# Patient Record
Sex: Male | Born: 1970 | Race: White | Hispanic: No | Marital: Married | Smoking: Never smoker
Health system: Southern US, Community
[De-identification: ages and names within clinical notes are randomized; demographics above are authoritative.]

## PROBLEM LIST (undated history)

## (undated) DIAGNOSIS — J45909 Unspecified asthma, uncomplicated: Secondary | ICD-10-CM

## (undated) DIAGNOSIS — Z1211 Encounter for screening for malignant neoplasm of colon: Secondary | ICD-10-CM

## (undated) DIAGNOSIS — R0789 Other chest pain: Secondary | ICD-10-CM

## (undated) DIAGNOSIS — R310 Gross hematuria: Secondary | ICD-10-CM

## (undated) DIAGNOSIS — Z8249 Family history of ischemic heart disease and other diseases of the circulatory system: Secondary | ICD-10-CM

## (undated) DIAGNOSIS — E7800 Pure hypercholesterolemia, unspecified: Secondary | ICD-10-CM

## (undated) DIAGNOSIS — Q433 Congenital malformations of intestinal fixation: Secondary | ICD-10-CM

## (undated) DIAGNOSIS — Z87442 Personal history of urinary calculi: Secondary | ICD-10-CM

## (undated) HISTORY — DX: Other chest pain: R07.89

## (undated) HISTORY — DX: Unspecified asthma, uncomplicated: J45.909

## (undated) HISTORY — PX: WISDOM TOOTH EXTRACTION: SHX21

## (undated) HISTORY — DX: Pure hypercholesterolemia, unspecified: E78.00

## (undated) HISTORY — DX: Encounter for screening for malignant neoplasm of colon: Z12.11

## (undated) HISTORY — PX: MOLE REMOVAL: SHX2046

## (undated) HISTORY — DX: Gross hematuria: R31.0

## (undated) HISTORY — DX: Congenital malformations of intestinal fixation: Q43.3

## (undated) HISTORY — DX: Family history of ischemic heart disease and other diseases of the circulatory system: Z82.49

---

## 2002-02-17 DIAGNOSIS — J45909 Unspecified asthma, uncomplicated: Secondary | ICD-10-CM

## 2002-02-17 HISTORY — DX: Unspecified asthma, uncomplicated: J45.909

## 2003-02-24 ENCOUNTER — Encounter: Admission: RE | Admit: 2003-02-24 | Discharge: 2003-02-24 | Payer: Self-pay | Admitting: Family Medicine

## 2004-09-20 ENCOUNTER — Ambulatory Visit: Payer: Self-pay | Admitting: Family Medicine

## 2006-02-20 ENCOUNTER — Ambulatory Visit: Payer: Self-pay | Admitting: Family Medicine

## 2006-04-08 ENCOUNTER — Ambulatory Visit: Payer: Self-pay | Admitting: Family Medicine

## 2006-08-13 ENCOUNTER — Encounter: Payer: Self-pay | Admitting: Family Medicine

## 2006-08-13 DIAGNOSIS — J45909 Unspecified asthma, uncomplicated: Secondary | ICD-10-CM | POA: Insufficient documentation

## 2006-09-03 ENCOUNTER — Ambulatory Visit: Payer: Self-pay | Admitting: Family Medicine

## 2006-09-03 LAB — CONVERTED CEMR LAB
ALT: 14 units/L (ref 0–53)
AST: 17 units/L (ref 0–37)
Alkaline Phosphatase: 63 units/L (ref 39–117)
BUN: 16 mg/dL (ref 6–23)
Basophils Relative: 0.5 % (ref 0.0–1.0)
CO2: 33 meq/L — ABNORMAL HIGH (ref 19–32)
Calcium: 9.4 mg/dL (ref 8.4–10.5)
Chloride: 104 meq/L (ref 96–112)
Creatinine, Ser: 1 mg/dL (ref 0.4–1.5)
Eosinophils Relative: 2.8 % (ref 0.0–5.0)
GFR calc Af Amer: 108 mL/min
Glucose, Bld: 96 mg/dL (ref 70–99)
HDL: 32.2 mg/dL — ABNORMAL LOW (ref >39.0)
LDL Cholesterol: 88 mg/dL (ref 0–99)
Monocytes Relative: 11.8 % — ABNORMAL HIGH (ref 3.0–11.0)
Platelets: 237 10*3/uL (ref 150–400)
RBC: 4.72 M/uL (ref 4.22–5.81)
RDW: 12.5 % (ref 11.5–14.6)
Total Bilirubin: 1 mg/dL (ref 0.3–1.2)
Total Protein: 6.9 g/dL (ref 6.0–8.3)
Triglycerides: 75 mg/dL (ref 0–149)
VLDL: 15 mg/dL (ref 0–40)
WBC: 5.7 10*3/uL (ref 4.5–10.5)

## 2006-09-10 ENCOUNTER — Ambulatory Visit: Payer: Self-pay | Admitting: Family Medicine

## 2006-09-10 DIAGNOSIS — R109 Unspecified abdominal pain: Secondary | ICD-10-CM | POA: Insufficient documentation

## 2006-09-10 DIAGNOSIS — IMO0002 Reserved for concepts with insufficient information to code with codable children: Secondary | ICD-10-CM | POA: Insufficient documentation

## 2006-09-10 DIAGNOSIS — M75100 Unspecified rotator cuff tear or rupture of unspecified shoulder, not specified as traumatic: Secondary | ICD-10-CM

## 2006-12-07 DIAGNOSIS — M549 Dorsalgia, unspecified: Secondary | ICD-10-CM | POA: Insufficient documentation

## 2006-12-11 ENCOUNTER — Ambulatory Visit: Payer: Self-pay | Admitting: Family Medicine

## 2007-11-02 ENCOUNTER — Ambulatory Visit: Payer: Self-pay | Admitting: Family Medicine

## 2007-11-02 LAB — CONVERTED CEMR LAB
ALT: 16 units/L (ref 0–53)
AST: 19 units/L (ref 0–37)
Basophils Absolute: 0 10*3/uL (ref 0.0–0.1)
Basophils Relative: 0.9 % (ref 0.0–3.0)
Bilirubin Urine: NEGATIVE
Bilirubin, Direct: 0.1 mg/dL (ref 0.0–0.3)
CO2: 31 meq/L (ref 19–32)
Chloride: 111 meq/L (ref 96–112)
Cholesterol: 117 mg/dL (ref 0–200)
LDL Cholesterol: 78 mg/dL (ref 0–99)
Lymphocytes Relative: 28.8 % (ref 12.0–46.0)
MCHC: 34.3 g/dL (ref 30.0–36.0)
Neutrophils Relative %: 54.3 % (ref 43.0–77.0)
Nitrite: NEGATIVE
Protein, U semiquant: NEGATIVE
RBC: 4.39 M/uL (ref 4.22–5.81)
RDW: 13.1 % (ref 11.5–14.6)
Sodium: 143 meq/L (ref 135–145)
Total Bilirubin: 0.9 mg/dL (ref 0.3–1.2)
Total CHOL/HDL Ratio: 4.8
Urobilinogen, UA: 0.2
VLDL: 15 mg/dL (ref 0–40)

## 2007-11-09 ENCOUNTER — Ambulatory Visit: Payer: Self-pay | Admitting: Family Medicine

## 2009-10-26 ENCOUNTER — Ambulatory Visit: Payer: Self-pay | Admitting: Family Medicine

## 2009-10-26 LAB — CONVERTED CEMR LAB
AST: 18 units/L (ref 0–37)
Albumin: 4.4 g/dL (ref 3.5–5.2)
Alkaline Phosphatase: 57 units/L (ref 39–117)
Basophils Absolute: 0 10*3/uL (ref 0.0–0.1)
Bilirubin, Direct: 0.2 mg/dL (ref 0.0–0.3)
Blood in Urine, dipstick: NEGATIVE
Calcium: 9.5 mg/dL (ref 8.4–10.5)
GFR calc non Af Amer: 91.82 mL/min (ref >60)
Glucose, Bld: 94 mg/dL (ref 70–99)
HDL: 33.7 mg/dL — ABNORMAL LOW (ref >39.00)
Hemoglobin: 14.3 g/dL (ref 13.0–17.0)
Lymphocytes Relative: 33.6 % (ref 12.0–46.0)
Monocytes Relative: 10.2 % (ref 3.0–12.0)
Neutro Abs: 2.8 10*3/uL (ref 1.4–7.7)
Neutrophils Relative %: 53.3 % (ref 43.0–77.0)
Nitrite: NEGATIVE
Protein, U semiquant: NEGATIVE
RDW: 13.8 % (ref 11.5–14.6)
Sodium: 141 meq/L (ref 135–145)
Total Bilirubin: 0.9 mg/dL (ref 0.3–1.2)
Total CHOL/HDL Ratio: 4
VLDL: 12.4 mg/dL (ref 0.0–40.0)
WBC Urine, dipstick: NEGATIVE
pH: 6

## 2009-11-01 ENCOUNTER — Ambulatory Visit: Payer: Self-pay | Admitting: Family Medicine

## 2010-03-09 ENCOUNTER — Encounter: Payer: Self-pay | Admitting: Family Medicine

## 2010-03-19 NOTE — Assessment & Plan Note (Signed)
Summary: cpx/njr   Vital Signs:  Patient profile:   40 year old male Height:      73.5 inches Weight:      191 pounds BMI:     24.95 Temp:     98.3 degrees F oral BP sitting:   110 / 80  (right arm) Cuff size:   regular  Vitals Entered By: Kathrynn Speed CMA (November 01, 2009 2:18 PM) CC: cpx, with lab review, src Is Patient Diabetic? No Flu Vaccine Consent Questions     Do you have a history of severe allergic reactions to this vaccine? no    Any prior history of allergic reactions to egg and/or gelatin? no    Do you have a sensitivity to the preservative Thimersol? no    Do you have a past history of Guillan-Barre Syndrome? no    Do you currently have an acute febrile illness? no    Have you ever had a severe reaction to latex? no    Vaccine information given and explained to patient? yes    Are you currently pregnant? no    Lot Number:[id number]   Exp Date:08/17/2010   Site Given  Left Deltoid IM   CC:  cpx, with lab review, and src.  History of Present Illness: Arthur Allen is a 40 year old, married male, nonsmoker, who comes in today for general medical examination since he is 40 years old.  He always been in excellent health.  Has had no chronic health problems.  He takes [place].  Review of systems negative.  Social history he and his wife Arthur Allen and two children.  Continue to be well.  He has 71 year old daughter and a 45-year-old son.  Preventive Screening-Counseling & Management  Alcohol-Tobacco     Smoking Status: never     Passive Smoke Exposure: no  Current Medications (verified): 1)  Aspir-81 81 Mg Tbec (Aspirin) .... Take 1 Tablet By Mouth Once A Day 2)  Co Q-10 100 Mg Caps (Coenzyme Q10) .... Take Once A Day 3)  Fish Oil 500 Mg Caps (Omega-3 Fatty Acids) .... Take 3 Capsule By Mouth Once A Day 4)  Mvi  Allergies (verified): 1)  Penicillin V Potassium (Penicillin V Potassium)  Past History:  Past medical, surgical, family and social histories (including  risk factors) reviewed, and no changes noted (except as noted below).  Past Medical History: Reviewed history from 08/13/2006 and no changes required. Asthma  Past Surgical History: Reviewed history from 08/13/2006 and no changes required. Denies surgical history  Family History: Reviewed history from 12/11/2006 and no changes required. Family History of CAD Male 1st degree relative <60 father died at 21 of an MI.  Mother said a history of ovarian cancer  Social History: Reviewed history from 09/10/2006 and no changes required. Married Never Smoked Alcohol use-no Regular exercise-yes  Review of Systems      See HPI  Physical Exam  General:  Well-developed,well-nourished,in no acute distress; alert,appropriate and cooperative throughout examination Head:  Normocephalic and atraumatic without obvious abnormalities. No apparent alopecia or balding. Eyes:  No corneal or conjunctival inflammation noted. EOMI. Perrla. Funduscopic exam benign, without hemorrhages, exudates or papilledema. Vision grossly normal. Ears:  External ear exam shows no significant lesions or deformities.  Otoscopic examination reveals clear canals, tympanic membranes are intact bilaterally without bulging, retraction, inflammation or discharge. Hearing is grossly normal bilaterally. Nose:  External nasal examination shows no deformity or inflammation. Nasal mucosa are pink and moist without lesions or exudates. Mouth:  Oral  mucosa and oropharynx without lesions or exudates.  Teeth in good repair. Neck:  No deformities, masses, or tenderness noted. Chest Wall:  No deformities, masses, tenderness or gynecomastia noted. Breasts:  No masses or gynecomastia noted Lungs:  Normal respiratory effort, chest expands symmetrically. Lungs are clear to auscultation, no crackles or wheezes. Heart:  Normal rate and regular rhythm. S1 and S2 normal without gallop, murmur, click, rub or other extra sounds. Abdomen:  Bowel  sounds positive,abdomen soft and non-tender without masses, organomegaly or hernias noted. Rectal:  No external abnormalities noted. Normal sphincter tone. No rectal masses or tenderness. Genitalia:  Testes bilaterally descended without nodularity, tenderness or masses. No scrotal masses or lesions. No penis lesions or urethral discharge. Prostate:  Prostate gland firm and smooth, no enlargement, nodularity, tenderness, mass, asymmetry or induration. Msk:  No deformity or scoliosis noted of thoracic or lumbar spine.   Pulses:  R and L carotid,radial,femoral,dorsalis pedis and posterior tibial pulses are full and equal bilaterally Extremities:  No clubbing, cyanosis, edema, or deformity noted with normal full range of motion of all joints.   Neurologic:  No cranial nerve deficits noted. Station and gait are normal. Plantar reflexes are down-going bilaterally. DTRs are symmetrical throughout. Sensory, motor and coordinative functions appear intact. Skin:  Intact without suspicious lesions or rashes Cervical Nodes:  No lymphadenopathy noted Axillary Nodes:  No palpable lymphadenopathy Inguinal Nodes:  No significant adenopathy Psych:  Cognition and judgment appear intact. Alert and cooperative with normal attention span and concentration. No apparent delusions, illusions, hallucinations   Impression & Recommendations:  Problem # 1:  HEALTH SCREENING (ICD-V70.0) Assessment Unchanged  Orders: EKG w/ Interpretation (93000)  Complete Medication List: 1)  Aspir-81 81 Mg Tbec (Aspirin) .... Take 1 tablet by mouth once a day 2)  Co Q-10 100 Mg Caps (Coenzyme q10) .... Take once a day 3)  Fish Oil 500 Mg Caps (Omega-3 fatty acids) .... Take 3 capsule by mouth once a day 4)  Mvi   Other Orders: Admin 1st Vaccine (29562) Flu Vaccine 52yrs + (13086)  Patient Instructions: 1)  It is important that you exercise regularly at least 20 minutes 5 times a week. If you develop chest pain, have severe  difficulty breathing, or feel very tired , stop exercising immediately and seek medical attention. 2)  Take an Aspirin every day 3)  return in two years for a general physical exam

## 2011-02-27 ENCOUNTER — Ambulatory Visit: Payer: Worker's Compensation

## 2011-02-28 ENCOUNTER — Ambulatory Visit: Payer: Worker's Compensation

## 2011-03-03 ENCOUNTER — Ambulatory Visit: Payer: Worker's Compensation

## 2012-08-24 ENCOUNTER — Ambulatory Visit: Payer: Self-pay | Admitting: Family Medicine

## 2012-09-01 ENCOUNTER — Ambulatory Visit (INDEPENDENT_AMBULATORY_CARE_PROVIDER_SITE_OTHER): Payer: BC Managed Care – PPO | Admitting: Family Medicine

## 2012-09-01 ENCOUNTER — Encounter: Payer: Self-pay | Admitting: Family Medicine

## 2012-09-01 VITALS — BP 101/65 | HR 57 | Temp 98.4°F | Resp 16 | Ht 73.5 in | Wt 186.0 lb

## 2012-09-01 DIAGNOSIS — H109 Unspecified conjunctivitis: Secondary | ICD-10-CM | POA: Insufficient documentation

## 2012-09-01 DIAGNOSIS — Z0000 Encounter for general adult medical examination without abnormal findings: Secondary | ICD-10-CM | POA: Insufficient documentation

## 2012-09-01 LAB — LIPID PANEL
Cholesterol: 149 mg/dL (ref 0–200)
HDL: 32.2 mg/dL — ABNORMAL LOW (ref >39.00)
LDL Cholesterol: 99 mg/dL (ref 0–99)
Total CHOL/HDL Ratio: 5
Triglycerides: 89 mg/dL (ref 0.0–149.0)

## 2012-09-01 LAB — COMPREHENSIVE METABOLIC PANEL
ALT: 15 U/L (ref 0–53)
AST: 20 U/L (ref 0–37)
BUN: 17 mg/dL (ref 6–23)
Creatinine, Ser: 1 mg/dL (ref 0.4–1.5)
GFR: 82.58 mL/min (ref >60.00)
Total Bilirubin: 0.7 mg/dL (ref 0.3–1.2)

## 2012-09-01 LAB — CBC WITH DIFFERENTIAL/PLATELET
Basophils Absolute: 0.1 10*3/uL (ref 0.0–0.1)
Basophils Relative: 0.9 % (ref 0.0–3.0)
Eosinophils Absolute: 0.3 10*3/uL (ref 0.0–0.7)
HCT: 44.7 % (ref 39.0–52.0)
Hemoglobin: 14.6 g/dL (ref 13.0–17.0)
Lymphs Abs: 2 10*3/uL (ref 0.7–4.0)
MCHC: 32.7 g/dL (ref 30.0–36.0)
MCV: 91.6 fl (ref 78.0–100.0)
Monocytes Absolute: 0.7 10*3/uL (ref 0.1–1.0)
Neutro Abs: 3.8 10*3/uL (ref 1.4–7.7)
RDW: 13.8 % (ref 11.5–14.6)
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
## 2012-09-01 NOTE — Assessment & Plan Note (Signed)
Reviewed age and gender appropriate health maintenance issues (prudent diet, regular exercise, health risks of tobacco and excessive alcohol, use of seatbelts, fire alarms in home, use of sunscreen).  Also reviewed age and gender appropriate health screening as well as vaccine recommendations. Vaccines UTD. Health panel labs today. Discussed ideal exercise and dietary recommendations for CVD prevention.

## 2012-09-01 NOTE — Assessment & Plan Note (Signed)
Resolved viral conjunctivitis.  Reassured pt.

## 2013-09-01 ENCOUNTER — Other Ambulatory Visit (INDEPENDENT_AMBULATORY_CARE_PROVIDER_SITE_OTHER): Payer: 59

## 2013-09-01 DIAGNOSIS — Z0000 Encounter for general adult medical examination without abnormal findings: Secondary | ICD-10-CM

## 2013-09-01 LAB — CBC WITH DIFFERENTIAL/PLATELET
BASOS PCT: 0.7 % (ref 0.0–3.0)
Basophils Absolute: 0 10*3/uL (ref 0.0–0.1)
EOS PCT: 3.3 % (ref 0.0–5.0)
Eosinophils Absolute: 0.2 10*3/uL (ref 0.0–0.7)
HCT: 42.7 % (ref 39.0–52.0)
Hemoglobin: 14.2 g/dL (ref 13.0–17.0)
LYMPHS PCT: 28.3 % (ref 12.0–46.0)
Lymphs Abs: 1.6 10*3/uL (ref 0.7–4.0)
MCHC: 33.2 g/dL (ref 30.0–36.0)
MCV: 90 fl (ref 78.0–100.0)
MONO ABS: 0.5 10*3/uL (ref 0.1–1.0)
Monocytes Relative: 9.3 % (ref 3.0–12.0)
NEUTROS PCT: 58.4 % (ref 43.0–77.0)
Neutro Abs: 3.3 10*3/uL (ref 1.4–7.7)
Platelets: 252 10*3/uL (ref 150.0–400.0)
RBC: 4.74 Mil/uL (ref 4.22–5.81)
RDW: 14.1 % (ref 11.5–15.5)
WBC: 5.7 10*3/uL (ref 4.0–10.5)

## 2013-09-01 LAB — COMPREHENSIVE METABOLIC PANEL
ALBUMIN: 4.5 g/dL (ref 3.5–5.2)
ALT: 20 U/L (ref 0–53)
AST: 20 U/L (ref 0–37)
Alkaline Phosphatase: 68 U/L (ref 39–117)
BUN: 16 mg/dL (ref 6–23)
CALCIUM: 9.6 mg/dL (ref 8.4–10.5)
CHLORIDE: 101 meq/L (ref 96–112)
CO2: 27 mEq/L (ref 19–32)
Creatinine, Ser: 1.1 mg/dL (ref 0.4–1.5)
GFR: 79.55 mL/min (ref >60.00)
GLUCOSE: 91 mg/dL (ref 70–99)
POTASSIUM: 4.2 meq/L (ref 3.5–5.1)
Sodium: 136 mEq/L (ref 135–145)
Total Bilirubin: 0.7 mg/dL (ref 0.2–1.2)
Total Protein: 7 g/dL (ref 6.0–8.3)

## 2013-09-01 LAB — LIPID PANEL
CHOLESTEROL: 154 mg/dL (ref 0–200)
HDL: 33.2 mg/dL — ABNORMAL LOW (ref >39.00)
LDL Cholesterol: 105 mg/dL — ABNORMAL HIGH (ref 0–99)
NonHDL: 120.8
TRIGLYCERIDES: 77 mg/dL (ref 0.0–149.0)
Total CHOL/HDL Ratio: 5
VLDL: 15.4 mg/dL (ref 0.0–40.0)

## 2013-09-01 LAB — TSH: TSH: 1.72 u[IU]/mL (ref 0.35–4.50)

## 2013-09-02 ENCOUNTER — Other Ambulatory Visit: Payer: BC Managed Care – PPO

## 2013-09-09 ENCOUNTER — Encounter: Payer: BC Managed Care – PPO | Admitting: Family Medicine

## 2013-09-15 ENCOUNTER — Encounter: Payer: Self-pay | Admitting: Family Medicine

## 2013-09-15 ENCOUNTER — Ambulatory Visit (INDEPENDENT_AMBULATORY_CARE_PROVIDER_SITE_OTHER): Payer: 59 | Admitting: Family Medicine

## 2013-09-15 VITALS — BP 115/70 | HR 68 | Temp 98.4°F | Resp 18 | Ht 73.5 in | Wt 193.0 lb

## 2013-09-15 DIAGNOSIS — Z0000 Encounter for general adult medical examination without abnormal findings: Secondary | ICD-10-CM
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
## 2013-09-15 NOTE — Assessment & Plan Note (Signed)
Reviewed age and gender appropriate health maintenance issues (prudent diet, regular exercise, health risks of tobacco and excessive alcohol, use of seatbelts, fire alarms in home, use of sunscreen).  Also reviewed age and gender appropriate health screening as well as vaccine recommendations. HP labs all normal I recommended a trial of bid otc lamisil for empiric treatment of tinea versicolor on arm and neck.  If not improving with this in 1 mo then I recommended he arrange f/u with his dermatologist to take a look at these spots.

## 2013-09-15 NOTE — Progress Notes (Signed)
Pre visit review using our clinic review tool, if applicable. No additional management support is needed unless otherwise documented below in the visit note. 

## 2015-02-18 HISTORY — PX: OTHER SURGICAL HISTORY: SHX169

## 2015-05-22 ENCOUNTER — Other Ambulatory Visit (INDEPENDENT_AMBULATORY_CARE_PROVIDER_SITE_OTHER): Payer: BLUE CROSS/BLUE SHIELD

## 2015-05-22 DIAGNOSIS — Z0000 Encounter for general adult medical examination without abnormal findings: Secondary | ICD-10-CM | POA: Diagnosis not present

## 2015-05-22 LAB — CBC WITH DIFFERENTIAL/PLATELET
BASOS PCT: 0.7 % (ref 0.0–3.0)
Basophils Absolute: 0 10*3/uL (ref 0.0–0.1)
EOS PCT: 5.4 % — AB (ref 0.0–5.0)
Eosinophils Absolute: 0.3 10*3/uL (ref 0.0–0.7)
HEMATOCRIT: 42.5 % (ref 39.0–52.0)
HEMOGLOBIN: 14.3 g/dL (ref 13.0–17.0)
LYMPHS PCT: 26.2 % (ref 12.0–46.0)
Lymphs Abs: 1.3 10*3/uL (ref 0.7–4.0)
MCHC: 33.6 g/dL (ref 30.0–36.0)
MCV: 88.6 fl (ref 78.0–100.0)
MONO ABS: 0.5 10*3/uL (ref 0.1–1.0)
MONOS PCT: 9.9 % (ref 3.0–12.0)
Neutro Abs: 2.8 10*3/uL (ref 1.4–7.7)
Neutrophils Relative %: 57.8 % (ref 43.0–77.0)
Platelets: 255 10*3/uL (ref 150.0–400.0)
RBC: 4.79 Mil/uL (ref 4.22–5.81)
RDW: 14.3 % (ref 11.5–15.5)
WBC: 4.8 10*3/uL (ref 4.0–10.5)

## 2015-05-22 LAB — LIPID PANEL
Cholesterol: 146 mg/dL (ref 0–200)
HDL: 37.2 mg/dL — ABNORMAL LOW (ref >39.00)
LDL CALC: 91 mg/dL (ref 0–99)
NONHDL: 108.67
Total CHOL/HDL Ratio: 4
Triglycerides: 88 mg/dL (ref 0.0–149.0)
VLDL: 17.6 mg/dL (ref 0.0–40.0)

## 2015-05-22 LAB — COMPREHENSIVE METABOLIC PANEL
ALBUMIN: 4.5 g/dL (ref 3.5–5.2)
ALK PHOS: 64 U/L (ref 39–117)
ALT: 15 U/L (ref 0–53)
AST: 16 U/L (ref 0–37)
BUN: 16 mg/dL (ref 6–23)
CO2: 30 mEq/L (ref 19–32)
CREATININE: 0.96 mg/dL (ref 0.40–1.50)
Calcium: 9.7 mg/dL (ref 8.4–10.5)
Chloride: 103 mEq/L (ref 96–112)
GFR: 89.47 mL/min (ref >60.00)
GLUCOSE: 101 mg/dL — AB (ref 70–99)
POTASSIUM: 4.2 meq/L (ref 3.5–5.1)
Sodium: 138 mEq/L (ref 135–145)
TOTAL PROTEIN: 6.7 g/dL (ref 6.0–8.3)
Total Bilirubin: 0.5 mg/dL (ref 0.2–1.2)

## 2015-05-22 LAB — TSH: TSH: 1.7 u[IU]/mL (ref 0.35–4.50)

## 2015-05-24 ENCOUNTER — Encounter: Payer: Self-pay | Admitting: Family Medicine

## 2015-05-24 ENCOUNTER — Ambulatory Visit (INDEPENDENT_AMBULATORY_CARE_PROVIDER_SITE_OTHER): Payer: BLUE CROSS/BLUE SHIELD | Admitting: Family Medicine

## 2015-05-24 VITALS — BP 102/70 | HR 53 | Temp 97.9°F | Resp 20 | Ht 73.5 in | Wt 198.2 lb

## 2015-05-24 DIAGNOSIS — R7301 Impaired fasting glucose: Secondary | ICD-10-CM | POA: Diagnosis not present

## 2015-05-24 DIAGNOSIS — Z23 Encounter for immunization: Secondary | ICD-10-CM

## 2015-05-24 DIAGNOSIS — Z0000 Encounter for general adult medical examination without abnormal findings: Secondary | ICD-10-CM

## 2015-05-24 LAB — POCT GLYCOSYLATED HEMOGLOBIN (HGB A1C): HEMOGLOBIN A1C: 5.4

## 2015-05-24 NOTE — Addendum Note (Signed)
Addended by: Smitty KnudsenSUTHERLAND, HEATHER K on: 05/24/2015 04:29 PM   Modules accepted: Orders, SmartSet

## 2015-05-24 NOTE — Progress Notes (Signed)
Office Note 05/24/2015  CC:  Chief Complaint  Patient presents with  . Annual Exam    HPI:  Arthur Allen is a 45 y.o. White male who is here for annual health maintenance exam. Feeling well, no acute complaints. Reviewed fasting lab results in detail.  The only mild concern was fasting glucose of 101. He is in favor of doing a POCT HbA1c today to look into this further.    Past Medical History  Diagnosis Date  . Asthmatic bronchitis 2004    Lone episode--?mold exposure when working around a flooded house  . Chest wall pain     Past Surgical History  Procedure Laterality Date  . Wisdom tooth extraction  age 45 yrs  . Mole removal      A few, benign.  Sees a dermatologist every few years.    Family History  Problem Relation Age of Onset  . Cancer Mother   . Heart disease Father   . COPD Maternal Grandfather   . Stroke Paternal Grandmother   . Heart disease Paternal Grandfather     Social History   Social History  . Marital Status: Married    Spouse Name: N/A  . Number of Children: N/A  . Years of Education: N/A   Occupational History  . Not on file.   Social History Main Topics  . Smoking status: Never Smoker   . Smokeless tobacco: Never Used  . Alcohol Use: Yes     Comment: rarely  . Drug Use: No  . Sexual Activity: Not on file   Other Topics Concern  . Not on file   Social History Narrative   Married, 2 children (1 boy 1 girl).   Orig from [place].   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.   No tob, rare alcohol, no drugs.   Exercise 3X/week.    Outpatient Prescriptions Prior to Visit  Medication Sig Dispense Refill  . aspirin 81 MG tablet Take 81 mg by mouth daily.    Marland Kitchen. co-enzyme Q-10 30 MG capsule Take 30 mg by mouth 3 (three) times daily.    . fish oil-omega-3 fatty acids 1000 MG capsule Take 2 g by mouth daily.    . Multiple Vitamin (MULTIVITAMIN)  capsule Take 1 capsule by mouth daily.     No facility-administered medications prior to visit.    Allergies  Allergen Reactions  . Penicillins     REACTION: as child    ROS Review of Systems  Constitutional: Negative for fever, chills, appetite change and fatigue.  HENT: Negative for congestion, dental problem, ear pain and sore throat.   Eyes: Negative for discharge, redness and visual disturbance.  Respiratory: Negative for cough, chest tightness, shortness of breath and wheezing.   Cardiovascular: Negative for chest pain, palpitations and leg swelling.  Gastrointestinal: Negative for nausea, vomiting, abdominal pain, diarrhea and blood in stool.  Genitourinary: Negative for dysuria, urgency, frequency, hematuria, flank pain and difficulty urinating.  Musculoskeletal: Negative for myalgias, back pain, joint swelling, arthralgias and neck stiffness.  Skin: Negative for pallor and rash.  Neurological: Negative for dizziness, speech difficulty, weakness and headaches.  Hematological: Negative for adenopathy. Does not bruise/bleed easily.  Psychiatric/Behavioral: Negative for confusion and sleep disturbance. The patient is not nervous/anxious.     PE; Blood pressure 102/70, pulse 53, temperature 97.9 F (36.6 C), temperature source Oral, resp. rate 20, height 6' 1.5" (1.867 m), weight 198 lb 4 oz (  89.926 kg), SpO2 95 %. Gen: Alert, well appearing.  Patient is oriented to person, place, time, and situation. AFFECT: pleasant, lucid thought and speech. ENT: Ears: EACs clear, normal epithelium.  TMs with good light reflex and landmarks bilaterally.  Eyes: no injection, icteris, swelling, or exudate.  EOMI, PERRLA. Nose: no drainage or turbinate edema/swelling.  No injection or focal lesion.  Mouth: lips without lesion/swelling.  Oral mucosa pink and moist.  Dentition intact and without obvious caries or gingival swelling.  Oropharynx without erythema, exudate, or swelling.  Neck:  supple/nontender.  No LAD, mass, or TM.  Carotid pulses 2+ bilaterally, without bruits. CV: RRR, no m/r/g.   LUNGS: CTA bilat, nonlabored resps, good aeration in all lung fields. ABD: soft, NT, ND, BS normal.  No hepatospenomegaly or mass.  No bruits. EXT: no clubbing, cyanosis, or edema.  Musculoskeletal: no joint swelling, erythema, warmth, or tenderness.  ROM of all joints intact. Skin - no sores or suspicious lesions or rashes or color changes   Pertinent labs:  Lab Results  Component Value Date   TSH 1.70 05/22/2015   Lab Results  Component Value Date   WBC 4.8 05/22/2015   HGB 14.3 05/22/2015   HCT 42.5 05/22/2015   MCV 88.6 05/22/2015   PLT 255.0 05/22/2015   Lab Results  Component Value Date   CREATININE 0.96 05/22/2015   BUN 16 05/22/2015   NA 138 05/22/2015   K 4.2 05/22/2015   CL 103 05/22/2015   CO2 30 05/22/2015   Lab Results  Component Value Date   ALT 15 05/22/2015   AST 16 05/22/2015   ALKPHOS 64 05/22/2015   BILITOT 0.5 05/22/2015   Lab Results  Component Value Date   CHOL 146 05/22/2015   Lab Results  Component Value Date   HDL 37.20* 05/22/2015   Lab Results  Component Value Date   LDLCALC 91 05/22/2015   Lab Results  Component Value Date   TRIG 88.0 05/22/2015   Lab Results  Component Value Date   CHOLHDL 4 05/22/2015   POCT HbA1c today: 5.4%  ASSESSMENT AND PLAN:   Reviewed age and gender appropriate health maintenance issues (prudent diet, regular exercise, health risks of tobacco and excessive alcohol, use of seatbelts, fire alarms in home, use of sunscreen).  Also reviewed age and gender appropriate health screening as well as vaccine recommendations. Tdap given today. Good fasting health panel results.  He'll work on diet/exercise/wt loss to try to help IFG and low HDL.  An After Visit Summary was printed and given to the patient.  FOLLOW UP:  Return in about 1 year (around 05/23/2016) for annual CPE with fasting labs the week  prior.  Signed:  Santiago Bumpers, MD           05/24/2015

## 2015-06-19 ENCOUNTER — Other Ambulatory Visit: Payer: Self-pay

## 2015-06-22 ENCOUNTER — Encounter: Payer: Self-pay | Admitting: Family Medicine

## 2015-06-28 ENCOUNTER — Encounter: Payer: Self-pay | Admitting: Family Medicine

## 2015-06-28 ENCOUNTER — Ambulatory Visit (INDEPENDENT_AMBULATORY_CARE_PROVIDER_SITE_OTHER): Payer: BLUE CROSS/BLUE SHIELD | Admitting: Family Medicine

## 2015-06-28 VITALS — BP 107/66 | HR 57 | Temp 98.0°F | Resp 16 | Ht 73.5 in | Wt 204.5 lb

## 2015-06-28 DIAGNOSIS — W57XXXA Bitten or stung by nonvenomous insect and other nonvenomous arthropods, initial encounter: Secondary | ICD-10-CM | POA: Diagnosis not present

## 2015-06-28 DIAGNOSIS — L089 Local infection of the skin and subcutaneous tissue, unspecified: Secondary | ICD-10-CM | POA: Diagnosis not present

## 2015-06-28 DIAGNOSIS — T148 Other injury of unspecified body region: Secondary | ICD-10-CM

## 2015-06-28 MED ORDER — DOXYCYCLINE HYCLATE 100 MG PO TABS: 100.0000 mg | ORAL_TABLET | Freq: Two times a day (BID) | ORAL | Status: DC

## 2015-06-28 MED ORDER — PREDNISONE 20 MG PO TABS: ORAL_TABLET | ORAL | Status: DC

## 2015-06-28 NOTE — Progress Notes (Signed)
Pre visit review using our clinic review tool, if applicable. No additional management support is needed unless otherwise documented below in the visit note. 

## 2015-06-28 NOTE — Progress Notes (Signed)
OFFICE VISIT  06/28/2015   CC:  Chief Complaint  Patient presents with  . Insect Bite    removed tick from back 2 days ago, area red, swollen and itchy    HPI:    Patient is a 45 y.o. Caucasian male who presents for tick bite.  Noted it on his back 2 days ago.  The bite area is slightly swollen, looks worse than usual bite from tick so he wanted to get it checked out.  No fever, no HA, no malaise, no myalgias, no other rashes. Has been applying some "first aid cream".  Past Medical History  Diagnosis Date  . Asthmatic bronchitis 2004    Lone episode--?mold exposure when working around a flooded house  . Chest wall pain     Past Surgical History  Procedure Laterality Date  . Wisdom tooth extraction  age 45 yrs  . Mole removal      A few, benign.  Sees a dermatologist every few years.    Outpatient Prescriptions Prior to Visit  Medication Sig Dispense Refill  . aspirin 81 MG tablet Take 81 mg by mouth daily.    Marland Kitchen. co-enzyme Q-10 30 MG capsule Take 30 mg by mouth 3 (three) times daily.    . fish oil-omega-3 fatty acids 1000 MG capsule Take 2 g by mouth daily.    . Multiple Vitamin (MULTIVITAMIN) capsule Take 1 capsule by mouth daily.     No facility-administered medications prior to visit.    Allergies  Allergen Reactions  . Penicillins     REACTION: as child    ROS As per HPI  PE: Blood pressure 107/66, pulse 57, temperature 98 F (36.7 C), temperature source Oral, resp. rate 16, height 6' 1.5" (1.867 m), weight 204 lb 8 oz (92.761 kg), SpO2 97 %. Gen: Alert, well appearing.  Patient is oriented to person, place, time, and situation. Right mid back with large splotch of pinkish erythema with bite mark in center---the pinkish area is indurated and mildly warm but nontender. No fluctuance.  I cannot appreciate a discreet abscess.  Longest dimension of the erythema is 12 cm, and the shortest dimension is 9 cm.  LABS:  none  IMPRESSION AND PLAN:  Tick bite,  possibly infected + significantly inflamed. Start prednisone 40mg  qd x 5d and doxycycline 100 mg bid x 7d. Recheck this in 4-5 d in office.  An After Visit Summary was printed and given to the patient.  FOLLOW UP: Return for 4-5 d f/u tick bite on back.  Signed:  Santiago BumpersPhil McGowen, MD           06/28/2015

## 2015-07-02 ENCOUNTER — Ambulatory Visit (INDEPENDENT_AMBULATORY_CARE_PROVIDER_SITE_OTHER): Payer: BLUE CROSS/BLUE SHIELD | Admitting: Family Medicine

## 2015-07-02 ENCOUNTER — Encounter: Payer: Self-pay | Admitting: Family Medicine

## 2015-07-02 ENCOUNTER — Ambulatory Visit: Payer: BLUE CROSS/BLUE SHIELD | Admitting: Family Medicine

## 2015-07-02 VITALS — BP 123/72 | HR 62 | Temp 97.9°F | Resp 16 | Ht 73.5 in | Wt 203.0 lb

## 2015-07-02 DIAGNOSIS — S60469D Insect bite (nonvenomous) of unspecified finger, subsequent encounter: Secondary | ICD-10-CM

## 2015-07-02 DIAGNOSIS — L089 Local infection of the skin and subcutaneous tissue, unspecified: Secondary | ICD-10-CM

## 2015-07-02 DIAGNOSIS — W57XXXD Bitten or stung by nonvenomous insect and other nonvenomous arthropods, subsequent encounter: Principal | ICD-10-CM

## 2015-07-02 NOTE — Progress Notes (Signed)
Pre visit review using our clinic review tool, if applicable. No additional management support is needed unless otherwise documented below in the visit note. 

## 2015-07-02 NOTE — Progress Notes (Signed)
OFFICE NOTE  07/02/2015  CC:  Chief Complaint  Patient presents with  . Follow-up    Tick bite     HPI: Patient is a 45 y.o. Caucasian male who is here for 4 day f/u tick bite on back that I felt was likely infected. Feeling good, no pain at bite area, just minimal itching.  Taking prednisone and doxycycline w/out side effect. No fevers, malaise, or myalgias or HA.  Pertinent PMH:  Past medical, surgical, social, and family history reviewed and no changes are noted since last office visit.  MEDS:  Outpatient Prescriptions Prior to Visit  Medication Sig Dispense Refill  . aspirin 81 MG tablet Take 81 mg by mouth daily.    Marland Kitchen. co-enzyme Q-10 30 MG capsule Take 30 mg by mouth 3 (three) times daily.    Marland Kitchen. doxycycline (VIBRA-TABS) 100 MG tablet Take 1 tablet (100 mg total) by mouth 2 (two) times daily. 14 tablet 0  . fish oil-omega-3 fatty acids 1000 MG capsule Take 2 g by mouth daily.    . Multiple Vitamin (MULTIVITAMIN) capsule Take 1 capsule by mouth daily.    . predniSONE (DELTASONE) 20 MG tablet 2 tabs po qd x 5d 10 tablet 0   No facility-administered medications prior to visit.    PE: Blood pressure 123/72, pulse 62, temperature 97.9 F (36.6 C), temperature source Oral, resp. rate 16, height 6' 1.5" (1.867 m), weight 203 lb (92.08 kg), SpO2 94 %. Gen: Alert, well appearing.  Patient is oriented to person, place, time, and situation. SKIN: left mid back with 4-5 cm oblong splotch of light pink skin with a small scab in the center. No significant induration, no fluctuance, no tenderness.  No warmth or streaking.  No target/bulls-eye lesion.  IMPRESSION AND PLAN:  Infected tick bite, nearly resolved on current treatments. Finish prednisone and doxycycline. Signs/symptoms to call or return for were reviewed and pt expressed understanding.  An After Visit Summary was printed and given to the patient.   FOLLOW UP: prn  Signed:  Santiago BumpersPhil Angell Honse, MD           07/02/2015

## 2015-10-09 ENCOUNTER — Other Ambulatory Visit: Payer: Self-pay | Admitting: Cardiology

## 2015-10-09 DIAGNOSIS — R0789 Other chest pain: Secondary | ICD-10-CM

## 2015-10-10 ENCOUNTER — Encounter: Payer: Self-pay | Admitting: Family Medicine

## 2015-10-16 HISTORY — PX: CARDIOVASCULAR STRESS TEST: SHX262

## 2015-10-17 ENCOUNTER — Ambulatory Visit
Admission: RE | Admit: 2015-10-17 | Discharge: 2015-10-17 | Disposition: A | Discharge status: Home / Self Care | Payer: No Typology Code available for payment source | Source: Ambulatory Visit | Attending: Cardiology | Admitting: Cardiology

## 2015-10-17 ENCOUNTER — Encounter: Payer: Self-pay | Admitting: Family Medicine

## 2015-10-17 DIAGNOSIS — R0789 Other chest pain: Secondary | ICD-10-CM

## 2017-10-08 ENCOUNTER — Encounter: Payer: Self-pay | Admitting: Family Medicine

## 2017-11-10 ENCOUNTER — Encounter: Payer: Self-pay | Admitting: Family Medicine

## 2017-11-10 ENCOUNTER — Ambulatory Visit (INDEPENDENT_AMBULATORY_CARE_PROVIDER_SITE_OTHER): Payer: 59 | Admitting: Family Medicine

## 2017-11-10 VITALS — BP 93/58 | HR 59 | Temp 98.5°F | Resp 16 | Ht 73.5 in | Wt 186.2 lb

## 2017-11-10 DIAGNOSIS — Z23 Encounter for immunization: Secondary | ICD-10-CM

## 2017-11-10 DIAGNOSIS — Z0000 Encounter for general adult medical examination without abnormal findings: Secondary | ICD-10-CM | POA: Diagnosis not present

## 2017-11-10 NOTE — Patient Instructions (Signed)

## 2017-11-10 NOTE — Progress Notes (Signed)
Office Note 11/10/2017  CC:  Chief Complaint  Patient presents with  . Annual Exam    Pt is fasting.    HPI:  Arthur Allen is a 47 y.o. male who is here for annual health maintenance exam.  TLC: doing great with nutrition (his son is the nutrition coach of the family), [place] [facility] [place] [place]. Runs about 2d/week as well.  Pt has done great with losing wt in 2019.  Dental and eye exams UTD.  Past Medical History:  Diagnosis Date  . Asthmatic bronchitis 2004   Lone episode--?mold exposure when working around a flooded house  . Chest wall pain   . Family history of early CAD    F. sudden death age 47.  Carmelina DanePat GF d of MI in his 5350s.  ETT NORMAL 10/16/15; coronary calcium score of ZERO 2017.    Past Surgical History:  Procedure Laterality Date  . CARDIOVASCULAR STRESS TEST  10/16/2015   NORMAL  . Coronary calcium score  2017   ZERO  . MOLE REMOVAL     A few, benign.  Sees a dermatologist every few years.  . WISDOM TOOTH EXTRACTION  age 47 yrs    Family History  Problem Relation Age of Onset  . Cancer Mother   . Heart disease Father   . COPD Maternal Grandfather   . Stroke Paternal Grandmother   . Heart disease Paternal Grandfather     Social History   Socioeconomic History  . Marital status: Married    Spouse name: Not on file  . Number of children: Not on file  . Years of education: Not on file  . Highest education level: Not on file  Occupational History  . Not on file  Social Needs  . Financial resource strain: Not on file  . Food insecurity:    Worry: Not on file    Inability: Not on file  . Transportation needs:    Medical: Not on file    Non-medical: Not on file  Tobacco Use  . Smoking status: Never Smoker  . Smokeless tobacco: Never Used  Substance and Sexual Activity  . Alcohol use: Yes    Comment: rarely  . Drug use: No  . Sexual activity: Not on file  Lifestyle  . Physical activity:    Days per week: Not on file   Minutes per session: Not on file  . Stress: Not on file  Relationships  . Social connections:    Talks on phone: Not on file    Gets together: Not on file    Attends religious service: Not on file    Active member of club or organization: Not on file    Attends meetings of clubs or organizations: Not on file    Relationship status: Not on file  . Intimate partner violence:    Fear of current or ex partner: Not on file    Emotionally abused: Not on file    Physically abused: Not on file    Forced sexual activity: Not on file  Other Topics Concern  . Not on file  Social History Narrative   Married, 2 children (1 boy 1 girl).   Orig from [place] of [place].   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   No tob, rare alcohol, no drugs.   Exercise 3X/week.    Outpatient Medications Prior to Visit  Medication Sig Dispense Refill  . aspirin 81 MG  tablet Take 81 mg by mouth daily.    Marland Kitchen co-enzyme Q-10 30 MG capsule Take 30 mg by mouth 3 (three) times daily.    . fish oil-omega-3 fatty acids 1000 MG capsule Take 2 g by mouth daily.    . Multiple Vitamin (MULTIVITAMIN) capsule Take 1 capsule by mouth daily.    Marland Kitchen doxycycline (VIBRA-TABS) 100 MG tablet Take 1 tablet (100 mg total) by mouth 2 (two) times daily. (Patient not taking: Reported on 11/10/2017) 14 tablet 0  . predniSONE (DELTASONE) 20 MG tablet 2 tabs po qd x 5d (Patient not taking: Reported on 11/10/2017) 10 tablet 0   No facility-administered medications prior to visit.     Allergies  Allergen Reactions  . Penicillins     REACTION: as child    ROS Review of Systems  Constitutional: Negative for appetite change, chills, fatigue and fever.  HENT: Negative for congestion, dental problem, ear pain and sore throat.   Eyes: Negative for discharge, redness and visual disturbance.  Respiratory: Negative for cough, chest tightness, shortness of breath and wheezing.    Cardiovascular: Negative for chest pain, palpitations and leg swelling.  Gastrointestinal: Negative for abdominal pain, blood in stool, diarrhea, nausea and vomiting.  Genitourinary: Negative for difficulty urinating, dysuria, flank pain, frequency, hematuria and urgency.  Musculoskeletal: Negative for arthralgias, back pain, joint swelling, myalgias and neck stiffness.  Skin: Negative for pallor and rash.  Neurological: Negative for dizziness, speech difficulty, weakness and headaches.  Hematological: Negative for adenopathy. Does not bruise/bleed easily.  Psychiatric/Behavioral: Negative for confusion and sleep disturbance. The patient is not nervous/anxious.     PE; Blood pressure (!) 93/58, pulse (!) 59, temperature 98.5 F (36.9 C), temperature source Oral, resp. rate 16, height 6' 1.5" (1.867 m), weight 186 lb 4 oz (84.5 kg), SpO2 95 %. Body mass index is 24.24 kg/m.  Gen: Alert, well appearing.  Patient is oriented to person, place, time, and situation. AFFECT: pleasant, lucid thought and speech. ENT: Ears: EACs clear, normal epithelium.  TMs with good light reflex and landmarks bilaterally.  Eyes: no injection, icteris, swelling, or exudate.  EOMI, PERRLA. Nose: no drainage or turbinate edema/swelling.  No injection or focal lesion.  Mouth: lips without lesion/swelling.  Oral mucosa pink and moist.  Dentition intact and without obvious caries or gingival swelling.  Oropharynx without erythema, exudate, or swelling.  Neck: supple/nontender.  No LAD, mass, or TM.  Carotid pulses 2+ bilaterally, without bruits. CV: RRR, no m/r/g.   LUNGS: CTA bilat, nonlabored resps, good aeration in all lung fields. ABD: soft, NT, ND, BS normal.  No hepatospenomegaly or mass.  No bruits. EXT: no clubbing, cyanosis, or edema.  Musculoskeletal: no joint swelling, erythema, warmth, or tenderness.  ROM of all joints intact. Skin - no sores or suspicious lesions or rashes or color changes   Pertinent  labs:  Lab Results  Component Value Date   TSH 1.70 05/22/2015   Lab Results  Component Value Date   WBC 4.8 05/22/2015   HGB 14.3 05/22/2015   HCT 42.5 05/22/2015   MCV 88.6 05/22/2015   PLT 255.0 05/22/2015   Lab Results  Component Value Date   CREATININE 0.96 05/22/2015   BUN 16 05/22/2015   NA 138 05/22/2015   K 4.2 05/22/2015   CL 103 05/22/2015   CO2 30 05/22/2015   Lab Results  Component Value Date   ALT 15 05/22/2015   AST 16 05/22/2015   ALKPHOS 64 05/22/2015   BILITOT  0.5 05/22/2015   Lab Results  Component Value Date   CHOL 146 05/22/2015   Lab Results  Component Value Date   HDL 37.20 (L) 05/22/2015   Lab Results  Component Value Date   LDLCALC 91 05/22/2015   Lab Results  Component Value Date   TRIG 88.0 05/22/2015   Lab Results  Component Value Date   CHOLHDL 4 05/22/2015    ASSESSMENT AND PLAN:   Health maintenance exam: Reviewed age and gender appropriate health maintenance issues (prudent diet, regular exercise, health risks of tobacco and excessive alcohol, use of seatbelts, fire alarms in home, use of sunscreen).  Also reviewed age and gender appropriate health screening as well as vaccine recommendations. Vaccines: flu vaccine-->given today. Labs: fasting HP Prostate ca screening: average risk patient= as per latest guidelines, start screening at 72 yrs of age. Colon ca screening: average risk patient= as per latest guidelines, start screening at 6 yrs of age.  DOING GREAT WITH TLC/WT LOSS:  BMI normal now.  An After Visit Summary was printed and given to the patient.  FOLLOW UP:  Return in about 1 year (around 11/11/2018) for annual CPE (fasting).  Signed:  Santiago Bumpers, MD           11/10/2017

## 2017-11-11 LAB — CBC WITH DIFFERENTIAL/PLATELET
Basophils Absolute: 62 cells/uL (ref 0–200)
Basophils Relative: 1.3 %
EOS ABS: 283 {cells}/uL (ref 15–500)
Eosinophils Relative: 5.9 %
HEMATOCRIT: 41.5 % (ref 38.5–50.0)
Hemoglobin: 14.4 g/dL (ref 13.2–17.1)
LYMPHS ABS: 1565 {cells}/uL (ref 850–3900)
MCH: 29.9 pg (ref 27.0–33.0)
MCHC: 34.7 g/dL (ref 32.0–36.0)
MCV: 86.1 fL (ref 80.0–100.0)
MPV: 11.5 fL (ref 7.5–12.5)
Monocytes Relative: 10.9 %
NEUTROS ABS: 2366 {cells}/uL (ref 1500–7800)
Neutrophils Relative %: 49.3 %
Platelets: 307 10*3/uL (ref 140–400)
RBC: 4.82 10*6/uL (ref 4.20–5.80)
RDW: 12.9 % (ref 11.0–15.0)
Total Lymphocyte: 32.6 %
WBC mixed population: 523 cells/uL (ref 200–950)
WBC: 4.8 10*3/uL (ref 3.8–10.8)

## 2017-11-11 LAB — COMPREHENSIVE METABOLIC PANEL
AG Ratio: 1.8 (calc) (ref 1.0–2.5)
ALT: 13 U/L (ref 9–46)
AST: 15 U/L (ref 10–40)
Albumin: 4.4 g/dL (ref 3.6–5.1)
Alkaline phosphatase (APISO): 68 U/L (ref 40–115)
BUN: 14 mg/dL (ref 7–25)
CO2: 26 mmol/L (ref 20–32)
CREATININE: 0.97 mg/dL (ref 0.60–1.35)
Calcium: 9.9 mg/dL (ref 8.6–10.3)
Chloride: 102 mmol/L (ref 98–110)
GLUCOSE: 89 mg/dL (ref 65–99)
Globulin: 2.4 g/dL (calc) (ref 1.9–3.7)
Potassium: 4.6 mmol/L (ref 3.5–5.3)
SODIUM: 138 mmol/L (ref 135–146)
TOTAL PROTEIN: 6.8 g/dL (ref 6.1–8.1)
Total Bilirubin: 0.8 mg/dL (ref 0.2–1.2)

## 2017-11-11 LAB — LIPID PANEL
Cholesterol: 143 mg/dL (ref <200)
HDL: 37 mg/dL — ABNORMAL LOW (ref >40)
LDL CHOLESTEROL (CALC): 90 mg/dL
NON-HDL CHOLESTEROL (CALC): 106 mg/dL (ref <130)
TRIGLYCERIDES: 72 mg/dL (ref <150)
Total CHOL/HDL Ratio: 3.9 (calc) (ref <5.0)

## 2017-11-11 LAB — TSH: TSH: 1.42 mIU/L (ref 0.40–4.50)

## 2018-02-24 ENCOUNTER — Ambulatory Visit (INDEPENDENT_AMBULATORY_CARE_PROVIDER_SITE_OTHER): Payer: 59 | Admitting: Family Medicine

## 2018-02-24 ENCOUNTER — Encounter: Payer: Self-pay | Admitting: Family Medicine

## 2018-02-24 VITALS — BP 111/67 | HR 59 | Temp 98.3°F | Resp 16 | Ht 73.5 in | Wt 182.2 lb

## 2018-02-24 DIAGNOSIS — R319 Hematuria, unspecified: Secondary | ICD-10-CM | POA: Diagnosis not present

## 2018-02-24 DIAGNOSIS — R310 Gross hematuria: Secondary | ICD-10-CM

## 2018-02-24 LAB — POC URINALSYSI DIPSTICK (AUTOMATED)
Bilirubin, UA: NEGATIVE
Glucose, UA: NEGATIVE
Ketones, UA: NEGATIVE
Leukocytes, UA: NEGATIVE
NITRITE UA: NEGATIVE
PH UA: 6 (ref 5.0–8.0)
Protein, UA: POSITIVE — AB
SPEC GRAV UA: 1.025 (ref 1.010–1.025)
UROBILINOGEN UA: 0.2 U/dL

## 2018-02-24 NOTE — Progress Notes (Signed)
OFFICE VISIT  02/24/2018   CC:  Chief Complaint  Patient presents with  . Hematuria    pt states that he noticed blood twice in his urine after intense workouts    HPI:    Patient is a 48 y.o. Caucasian male who presents for blood in urine. On two separate occasions he noted pinkish colored urine right after he had done some vigorous exercise. No pain.  No more episodes of discolored urine until about a week later he went for a run for about 2-3 miles, and he once again had pinkish urine right after. This did not recur.  This most recent episode was 9 d/a. He has not done any vigorous exercise since that time.  STill no pain in CVA area, flank, or suprapubic region. No dysuria, no urgency, no urinary frequency. No recent trauma. He has never had this in the past.  No recent new rx or OTC meds.  No unintentional wt loss.  No F/C or night sweats. No FH of bladder or kidney cancer. No exposure to chemicals/dyes.  ROS: no blood in stool, no nosebleeds, no melena, no easy bruising.  Past Medical History:  Diagnosis Date  . Asthmatic bronchitis 2004   Lone episode--?mold exposure when working around a flooded house  . Chest wall pain   . Family history of early CAD    F. sudden death age 65.  Carmelina Dane d of MI in his 63s.  ETT NORMAL 10/16/15; coronary calcium score of ZERO 2017.    Past Surgical History:  Procedure Laterality Date  . CARDIOVASCULAR STRESS TEST  10/16/2015   NORMAL  . Coronary calcium score  2017   ZERO  . MOLE REMOVAL     A few, benign.  Sees a dermatologist every few years.  . WISDOM TOOTH EXTRACTION  age 58 yrs    Outpatient Medications Prior to Visit  Medication Sig Dispense Refill  . aspirin 81 MG tablet Take 81 mg by mouth daily.    Marland Kitchen co-enzyme Q-10 30 MG capsule Take 30 mg by mouth 3 (three) times daily.    . fish oil-omega-3 fatty acids 1000 MG capsule Take 2 g by mouth daily.    . Multiple Vitamin (MULTIVITAMIN) capsule Take 1 capsule by mouth daily.      No facility-administered medications prior to visit.     Allergies  Allergen Reactions  . Penicillins     REACTION: as child    ROS As per HPI  PE: Blood pressure 111/67, pulse (!) 59, temperature 98.3 F (36.8 C), temperature source Oral, resp. rate 16, height 6' 1.5" (1.867 m), weight 182 lb 4 oz (82.7 kg), SpO2 97 %. Gen: Alert, well appearing.  Patient is oriented to person, place, time, and situation. AFFECT: pleasant, lucid thought and speech. FSE:LTRV: no injection, icteris, swelling, or exudate.  EOMI, PERRLA. Mouth: lips without lesion/swelling.  Oral mucosa pink and moist. Oropharynx without erythema, exudate, or swelling.  CV: RRR, no m/r/g.   LUNGS: CTA bilat, nonlabored resps, good aeration in all lung fields. ABD: soft, NT, ND, BS normal.  No hepatospenomegaly or mass.  No bruits. EXT: no clubbing or cyanosis.  no edema.    LABS:    Chemistry      Component Value Date/Time   NA 138 11/10/2017 0918   K 4.6 11/10/2017 0918   CL 102 11/10/2017 0918   CO2 26 11/10/2017 0918   BUN 14 11/10/2017 0918   CREATININE 0.97 11/10/2017 2023  Component Value Date/Time   CALCIUM 9.9 11/10/2017 0918   ALKPHOS 64 05/22/2015 0818   AST 15 11/10/2017 0918   ALT 13 11/10/2017 0918   BILITOT 0.8 11/10/2017 0918     UA today: trace prot, trace blood.  IMPRESSION AND PLAN:  Painless gross hematuria, exercise induced is most likely dx. However, in the 48 y/o male I believe this is a diagnosis of exclusion. Urine today unremarkable.   Discussed general w/u for gross hematuria. Will refer to urologist and defer any decision about imaging to them.  An After Visit Summary was printed and given to the patient.  FOLLOW UP: Return for as needed.  Signed:  Santiago Bumpers, MD           02/24/2018

## 2018-03-20 DIAGNOSIS — R310 Gross hematuria: Secondary | ICD-10-CM

## 2018-03-20 HISTORY — DX: Gross hematuria: R31.0

## 2018-03-31 ENCOUNTER — Encounter (HOSPITAL_COMMUNITY): Payer: Self-pay

## 2018-03-31 ENCOUNTER — Emergency Department (HOSPITAL_COMMUNITY)
Admission: EM | Admit: 2018-03-31 | Discharge: 2018-03-31 | Disposition: A | Discharge status: Home / Self Care | Payer: 59 | Attending: Emergency Medicine | Admitting: Emergency Medicine

## 2018-03-31 ENCOUNTER — Ambulatory Visit: Payer: Self-pay

## 2018-03-31 ENCOUNTER — Other Ambulatory Visit: Payer: Self-pay

## 2018-03-31 DIAGNOSIS — Z5321 Procedure and treatment not carried out due to patient leaving prior to being seen by health care provider: Secondary | ICD-10-CM | POA: Insufficient documentation

## 2018-03-31 DIAGNOSIS — R1031 Right lower quadrant pain: Secondary | ICD-10-CM | POA: Diagnosis not present

## 2018-03-31 HISTORY — PX: CT ABDOMEN PELVIS WO (ARMC HX): HXRAD1334

## 2018-03-31 MED ORDER — SODIUM CHLORIDE 0.9% FLUSH: 3.0000 mL | Freq: Once | INTRAVENOUS | Status: DC

## 2018-03-31 NOTE — ED Triage Notes (Signed)
Patient c/o right testicle pain and right lower abdominal pain that started approx 1 1/2 hours ago.

## 2018-03-31 NOTE — Telephone Encounter (Signed)
Incoming  Call from  Patient  With complaint lower  Abdomina pain.  Patient  States that  Pain  Is  Intense moderate,  Approaching   severe pain.  Denies  Fever,  Vomiting, diarrhea, urinating  Problems ,  Constipation.  Patient  States it hurts  To  Sit  Down.  Reviewed  protocol  With  Patient.  Recommended   Patient  Go to  [facility]  ED or  Urgent  Care,  To  Be  Evaluated.  Patient voiced understanding and states he would  Go.    Reason for Disposition . [1] SEVERE pain (e.g., excruciating) AND [2] present > 1 hour  Answer Assessment - Initial Assessment Questions 1. LOCATION: "Where does it hurt?"      r  side 2. RADIATION: "Does the pain shoot anywhere else?" (e.g., chest, back)     No right side 3. ONSET: "When did the pain begin?" (Minutes, hours or days ago)      This  morning 4. SUDDEN: "Gradual or sudden onset?"     Sudden on the way to work  Past  Hour  And  half 5. PATTERN "Does the pain come and go, or is it constant?"    - If constant: "Is it getting better, staying the same, or worsening?"      (Note: Constant means the pain never goes away completely; most serious pain is constant and it progresses)     - If intermittent: "How long does it last?" "Do you have pain now?"     (Note: Intermittent means the pain goes away completely between bouts)     Constant  6. SEVERITY: "How bad is the pain?"  (e.g., Scale 1-10; mild, moderate, or severe)    - MILD (1-3): doesn't interfere with normal activities, abdomen soft and not tender to touch     - MODERATE (4-7): interferes with normal activities or awakens from sleep, tender to touch     - SEVERE (8-10): excruciating pain, doubled over, unable to do any normal activities       Approaching  severe 7. RECURRENT SYMPTOM: "Have you ever had this type of abdominal pain before?" If so, ask: "When was the last time?" and "What happened that time?"      no 8. CAUSE: "What do you think is causing the abdominal pain?"     *No Answer* 9.  RELIEVING/AGGRAVATING FACTORS: "What makes it better or worse?" (e.g., movement, antacids, bowel movement)     Hurts to  Sit  down 10. OTHER SYMPTOMS: "Has there been any vomiting, diarrhea, constipation, or urine problems?"       denies  Protocols used: ABDOMINAL PAIN - MALE-A-AH

## 2018-03-31 NOTE — Telephone Encounter (Signed)
Noted  

## 2018-04-02 ENCOUNTER — Encounter: Payer: Self-pay | Admitting: *Deleted

## 2018-04-04 ENCOUNTER — Encounter: Payer: Self-pay | Admitting: Family Medicine

## 2018-04-08 ENCOUNTER — Other Ambulatory Visit: Payer: Self-pay | Admitting: Urology

## 2018-04-14 NOTE — H&P (Signed)
Office Visit Report     03/31/2018   --------------------------------------------------------------------------------   Arthur Allen. Arthur Allen  MRN: [id number]  PRIMARY CARE:    DOB: April 09, 1970, 48 year old Male  REFERRING:  Shawnie Dapper, MD  SSN: [id number]  PROVIDER:  Festus Aloe, M.D.    TREATING:  Franchot Gallo, M.D.    LOCATION:  [facility] [phone]   --------------------------------------------------------------------------------   CC: I have pain in the flank.  HPI: Arthur Allen is a 48 year-old male patient who was referred by Dr. Shawnie Dapper, MD who is here for flank pain.  The problem is on the right side.   Sudden onset right flank pain/lower quadrant pain early this morning, worsened when driving to work. He went to the emergency room at [facility] but pain resolved while waiting to be seen. He subsequently checked out. It was slightly crampy, not associated with nausea or vomiting. Not associated with lower urinary tract symptomatology. It has now resolved.     CC: I have blood in my urine.  HPI: He did see the blood in his urine. He first noticed the symptoms approximately 01/17/2018.   He does not have a burning sensation when he urinates. He is not currently having trouble urinating.   He is having pain. He has not recently had unwanted weight loss.   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 It resolved. He did have microscopic hematuria on follow-up urinalysis on the the January. He has not had gross hematuria since that time.     ALLERGIES: Penicillin    MEDICATIONS: Aspirin  Co Q10  Fish Oil  Multiple Vitamin     GU PSH: No GU PSH      PSH Notes: Wisdom Teeth Removal   NON-GU PSH: No Non-GU PSH    GU PMH: None   NON-GU PMH: Asthma    FAMILY HISTORY: 1 Daughter - Other 1 son -  Other father deceased from heart attack - Father Heart Disease - Father Kidney Stones - Brother, Father Prostate Cancer - Grandfather   SOCIAL HISTORY: Marital Status: Married Current Smoking Status: Patient has never smoked.   Tobacco Use Assessment Completed: Used Tobacco in last 30 days? Drinks 1 drink per day.  Drinks 1 caffeinated drink per day. Patient's occupation Heritage manager.    REVIEW OF SYSTEMS:    GU Review Male:   Patient denies burning/ pain with urination, have to strain to urinate , erection problems, penile pain, leakage of urine, trouble starting your stream, frequent urination, get up at night to urinate, stream starts and stops, and hard to postpone urination.  Gastrointestinal (Upper):   Patient denies nausea, vomiting, and indigestion/ heartburn.  Gastrointestinal (Lower):   Patient denies diarrhea and constipation.  Constitutional:   Patient denies fever, night sweats, weight loss, and fatigue.  Skin:   Patient denies skin rash/ lesion and itching.  Eyes:   Patient denies blurred vision and double vision.  Ears/ Nose/ Throat:   Patient denies sore throat and sinus problems.  Hematologic/Lymphatic:   Patient denies swollen glands and easy bruising.  Cardiovascular:   Patient denies leg swelling and chest pains.  Respiratory:   Patient denies cough and shortness of breath.  Endocrine:   Patient denies excessive thirst.  Musculoskeletal:   Patient denies back pain and joint pain.  Neurological:   Patient denies headaches and dizziness.  Psychologic:  Patient denies depression and anxiety.   Notes: Abdominal pain, Hematuria    VITAL SIGNS:      03/31/2018 01:45 PM  Weight 176 lb / 79.83 kg  Height 74 in / 187.96 cm  BP 108/72 mmHg  Pulse 65 /min  Temperature 98.2 F / 36.7 C  BMI 22.6 kg/m   GU PHYSICAL EXAMINATION:    Anus and Perineum: No hemorrhoids. No anal stenosis. No rectal fissure, no anal fissure. No edema, no dimple, no perineal tenderness,  no anal tenderness.  Scrotum: No lesions. No edema. No cysts. No warts.  Epididymides: Right: no spermatocele, no masses, no cysts, no tenderness, no induration, no enlargement. Left: no spermatocele, no masses, no cysts, no tenderness, no induration, no enlargement.  Testes: No tenderness, no swelling, no enlargement left testes. No tenderness, no swelling, no enlargement right testes. Normal location left testes. Normal location right testes. No mass, no cyst, no varicocele, no hydrocele left testes. No mass, no cyst, no varicocele, no hydrocele right testes.  Urethral Meatus: Normal size. No lesion, no wart, no discharge, no polyp. Normal location.  Penis: Circumcised, no warts, no cracks. No dorsal Peyronie's plaques, no left corporal Peyronie's plaques, no right corporal Peyronie's plaques, no scarring, no warts. No balanitis, no meatal stenosis.  Prostate: 40 gram or 2+ size. Left lobe normal consistency, right lobe normal consistency. Symmetrical lobes. No prostate nodule. Left lobe no tenderness, right lobe no tenderness.  Seminal Vesicles: Nonpalpable.  Sphincter Tone: Normal sphincter. No rectal tenderness. No rectal mass.    MULTI-SYSTEM PHYSICAL EXAMINATION:    Constitutional: Well-nourished. No physical deformities. Normally developed. Good grooming.  Neck: Neck symmetrical, not swollen. Normal tracheal position.  Respiratory: No labored breathing, no use of accessory muscles.   Skin: No paleness, no jaundice, no cyanosis. No lesion, no ulcer, no rash.  Neurologic / Psychiatric: Oriented to time, oriented to place, oriented to person. No depression, no anxiety, no agitation.  Gastrointestinal: No hernia. No mass, no tenderness, no rigidity, non obese abdomen.   Eyes: Normal conjunctivae. Normal eyelids.  Ears, Nose, Mouth, and Throat: Left ear no scars, no lesions, no masses. Right ear no scars, no lesions, no masses. Nose no scars, no lesions, no masses. Normal hearing. Normal lips.   Musculoskeletal: Normal gait and station of head and neck.     PAST DATA REVIEWED:  Source Of History:  Patient  Records Review:   Previous Doctor Records  Urine Test Review:   Urinalysis   PROCEDURES:         C.T. Urogram - 93235  5x9 mm rt upper ureteralm =stone. SSD 12 cm.       500 HU. Can see on scout film.         Urinalysis w/Scope Dipstick Dipstick Cont'd Micro  Color: Yellow Bilirubin: Neg mg/dL WBC/hpf: 0 - 5/hpf  Appearance: Clear Ketones: Trace mg/dL RBC/hpf: 0 - 2/hpf  Specific Gravity: <=1.005 Blood: 3+ ery/uL Bacteria: NS (Not Seen)  pH: 6.5 Protein: Neg mg/dL Cystals: Ca Oxalate  Glucose: Neg mg/dL Urobilinogen: 0.2 mg/dL Casts: NS (Not Seen)    Nitrites: Neg Trichomonas: Not Present    Leukocyte Esterase: Neg leu/uL Mucous: Not Present      Epithelial Cells: NS (Not Seen)      Yeast: NS (Not Seen)      Sperm: Not Present    ASSESSMENT:      ICD-10 Details  1 GU:   Ureteral calculus - N20.1 Right, 5x9 mm rt upper ureteral stone.  Occasionally symptomatic--gross hematuria, flank pain  2   Flank Pain - R10.84   3   Gross hematuria - R31.0    PLAN:            Medications New Meds: Tamsulosin Hcl 0.4 mg capsule 1 capsule PO Daily   #30  0 Refill(s)  Norco 10 mg-325 mg tablet 1 tablet PO q 6 hr prn pain   #10  0 Refill(s)            Orders X-Rays: C.T. Stone Protocol Without Contrast  X-Ray Notes: History:  Hematuria: Yes/No  Patient to see MD after exam: Yes/No  Previous exam: CT / IVP/ US/ KUB/ None  When:  Where:  Diabetic: Yes/ No  BUN/ Creatinine:  Date of last BUN Creatinine:  Weight in pounds:  Allergy- IV Contrast: Yes/ No  Conflicting diabetic meds: Yes/ No  Diabetic Meds:  Prior Authorization #: [id number]            Schedule X-Rays: 3 Weeks - KUB  Return Visit/Planned Activity: 3 Weeks - Extender          Document Letter(s):  Created for Patient: Clinical Summary         Notes:   1 Treatment options  discussed--MET, URS/HLL/stone extraction, stone free rates, risks/complications   2. He would like to give MET a try. Will send in pain med, flomax and schedule followup with APP/KUB   cc: Dr Anitra Lauth        Next Appointment:      Next Appointment: 04/20/2018 07:45 AM    Appointment Type: KUB    Location: [facility] [phone]    Provider: KUB KUB    Reason for Visit: 3wk f/[id number]      * Signed by Franchot Gallo, M.D. on 03/31/18 at 7:50 PM (EST)*

## 2018-04-15 ENCOUNTER — Ambulatory Visit (HOSPITAL_COMMUNITY): Payer: 59

## 2018-04-15 ENCOUNTER — Encounter (HOSPITAL_COMMUNITY): Payer: Self-pay | Admitting: General Practice

## 2018-04-15 ENCOUNTER — Ambulatory Visit (HOSPITAL_COMMUNITY)
Admission: RE | Admit: 2018-04-15 | Discharge: 2018-04-15 | Disposition: A | Discharge status: Home / Self Care | Payer: 59 | Source: Other Acute Inpatient Hospital | Attending: Urology | Admitting: Urology

## 2018-04-15 ENCOUNTER — Encounter (HOSPITAL_COMMUNITY)
Admission: RE | Disposition: A | Discharge status: Home / Self Care | Payer: Self-pay | Source: Other Acute Inpatient Hospital | Attending: Urology

## 2018-04-15 DIAGNOSIS — Z8042 Family history of malignant neoplasm of prostate: Secondary | ICD-10-CM | POA: Diagnosis not present

## 2018-04-15 DIAGNOSIS — Z880 Allergy status to penicillin: Secondary | ICD-10-CM | POA: Diagnosis not present

## 2018-04-15 DIAGNOSIS — Z841 Family history of disorders of kidney and ureter: Secondary | ICD-10-CM | POA: Insufficient documentation

## 2018-04-15 DIAGNOSIS — Z8249 Family history of ischemic heart disease and other diseases of the circulatory system: Secondary | ICD-10-CM | POA: Insufficient documentation

## 2018-04-15 DIAGNOSIS — R310 Gross hematuria: Secondary | ICD-10-CM | POA: Diagnosis not present

## 2018-04-15 DIAGNOSIS — N201 Calculus of ureter: Secondary | ICD-10-CM

## 2018-04-15 DIAGNOSIS — Z7982 Long term (current) use of aspirin: Secondary | ICD-10-CM | POA: Diagnosis not present

## 2018-04-15 DIAGNOSIS — Z87442 Personal history of urinary calculi: Secondary | ICD-10-CM | POA: Diagnosis not present

## 2018-04-15 HISTORY — PX: EXTRACORPOREAL SHOCK WAVE LITHOTRIPSY: SHX1557

## 2018-04-15 HISTORY — DX: Personal history of urinary calculi: Z87.442

## 2018-04-15 SURGERY — LITHOTRIPSY, ESWL
Anesthesia: LOCAL | Laterality: Right

## 2018-04-15 MED ORDER — DIPHENHYDRAMINE HCL 25 MG PO CAPS
25.0000 mg | ORAL_CAPSULE | ORAL | Status: AC
  Administered 2018-04-15: 25 mg via ORAL
  Filled 2018-04-15: qty 1

## 2018-04-15 MED ORDER — SODIUM CHLORIDE 0.9 % IV SOLN
INTRAVENOUS | Status: DC
  Administered 2018-04-15: 07:00:00 via INTRAVENOUS

## 2018-04-15 MED ORDER — DIAZEPAM 5 MG PO TABS
10.0000 mg | ORAL_TABLET | ORAL | Status: AC
  Administered 2018-04-15: 10 mg via ORAL
  Filled 2018-04-15: qty 2

## 2018-04-15 MED ORDER — CIPROFLOXACIN HCL 500 MG PO TABS
500.0000 mg | ORAL_TABLET | ORAL | Status: AC
  Administered 2018-04-15: 500 mg via ORAL
  Filled 2018-04-15: qty 1

## 2018-04-15 NOTE — Interval H&P Note (Signed)
History and Physical Interval Note:  04/15/2018 7:32 AM  Arthur Allen  has presented today for surgery, with the diagnosis of RIGHT UPPER URETERAL STONE  The various methods of treatment have been discussed with the patient and family. After consideration of risks, benefits and other options for treatment, the patient has consented to  Procedure(s): EXTRACORPOREAL SHOCK WAVE LITHOTRIPSY (ESWL) (Right) as a surgical intervention .  The patient's history has been reviewed, patient examined, no change in status, stable for surgery.  I have reviewed the patient's chart and labs.  Questions were answered to the patient's satisfaction.     [employer]

## 2018-04-15 NOTE — Op Note (Signed)
See [facility] operative note scanned into chart. Also because of the size, density, location and other factors that cannot be anticipated I feel this will likely be a staged procedure. This fact supersedes any indication in the scanned [facility] operative note to the contrary.  

## 2018-04-15 NOTE — Discharge Instructions (Signed)
1. You should strain your urine and collect all fragments and bring them to your follow up appointment.  °2. You should take your pain medication as needed.  Please call if your pain is severe to the point that it is not controlled with your pain medication. °3. You should call if you develop fever > 101 or persistent nausea or vomiting. °4. Your doctor may prescribe tamsulosin to take to help facilitate stone passage. °

## 2018-04-16 ENCOUNTER — Encounter (HOSPITAL_COMMUNITY): Payer: Self-pay | Admitting: Urology

## 2018-04-20 DIAGNOSIS — Z87442 Personal history of urinary calculi: Secondary | ICD-10-CM

## 2018-04-20 HISTORY — DX: Personal history of urinary calculi: Z87.442

## 2018-05-02 ENCOUNTER — Encounter: Payer: Self-pay | Admitting: Family Medicine

## 2018-11-16 ENCOUNTER — Encounter: Payer: Self-pay | Admitting: Family Medicine

## 2018-11-16 ENCOUNTER — Encounter: Payer: 59 | Admitting: Family Medicine

## 2018-11-16 ENCOUNTER — Ambulatory Visit (INDEPENDENT_AMBULATORY_CARE_PROVIDER_SITE_OTHER): Payer: 59 | Admitting: Family Medicine

## 2018-11-16 ENCOUNTER — Other Ambulatory Visit: Payer: Self-pay

## 2018-11-16 VITALS — BP 108/72 | HR 64 | Temp 98.7°F | Resp 16 | Ht 74.0 in | Wt 183.6 lb

## 2018-11-16 DIAGNOSIS — Z23 Encounter for immunization: Secondary | ICD-10-CM | POA: Diagnosis not present

## 2018-11-16 DIAGNOSIS — Z0000 Encounter for general adult medical examination without abnormal findings: Secondary | ICD-10-CM

## 2018-11-16 NOTE — Patient Instructions (Signed)

## 2018-11-16 NOTE — Addendum Note (Signed)
Addended by: Deveron Furlong D on: 11/16/2018 08:23 AM   Modules accepted: Orders
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
## 2018-11-17 LAB — CBC WITH DIFFERENTIAL/PLATELET
Absolute Monocytes: 410 cells/uL (ref 200–950)
Basophils Absolute: 49 cells/uL (ref 0–200)
Basophils Relative: 1.2 %
Eosinophils Absolute: 172 cells/uL (ref 15–500)
Eosinophils Relative: 4.2 %
HCT: 42.1 % (ref 38.5–50.0)
Hemoglobin: 14.1 g/dL (ref 13.2–17.1)
Lymphs Abs: 1287 cells/uL (ref 850–3900)
MCH: 29.7 pg (ref 27.0–33.0)
MCHC: 33.5 g/dL (ref 32.0–36.0)
MCV: 88.6 fL (ref 80.0–100.0)
MPV: 11.6 fL (ref 7.5–12.5)
Monocytes Relative: 10 %
Neutro Abs: 2181 cells/uL (ref 1500–7800)
Neutrophils Relative %: 53.2 %
Platelets: 261 10*3/uL (ref 140–400)
RBC: 4.75 10*6/uL (ref 4.20–5.80)
RDW: 12.6 % (ref 11.0–15.0)
Total Lymphocyte: 31.4 %
WBC: 4.1 10*3/uL (ref 3.8–10.8)

## 2018-11-17 LAB — COMPREHENSIVE METABOLIC PANEL
AG Ratio: 1.9 (calc) (ref 1.0–2.5)
ALT: 13 U/L (ref 9–46)
AST: 17 U/L (ref 10–40)
Albumin: 4.3 g/dL (ref 3.6–5.1)
Alkaline phosphatase (APISO): 55 U/L (ref 36–130)
BUN: 15 mg/dL (ref 7–25)
CO2: 26 mmol/L (ref 20–32)
Calcium: 9.3 mg/dL (ref 8.6–10.3)
Chloride: 105 mmol/L (ref 98–110)
Creat: 0.98 mg/dL (ref 0.60–1.35)
Globulin: 2.3 g/dL (calc) (ref 1.9–3.7)
Glucose, Bld: 99 mg/dL (ref 65–99)
Potassium: 4.5 mmol/L (ref 3.5–5.3)
Sodium: 140 mmol/L (ref 135–146)
Total Bilirubin: 0.4 mg/dL (ref 0.2–1.2)
Total Protein: 6.6 g/dL (ref 6.1–8.1)

## 2018-11-17 LAB — LIPID PANEL
Cholesterol: 136 mg/dL (ref <200)
HDL: 39 mg/dL — ABNORMAL LOW (ref >=40)
LDL Cholesterol (Calc): 82 mg/dL (calc)
Non-HDL Cholesterol (Calc): 97 mg/dL (calc) (ref <130)
Total CHOL/HDL Ratio: 3.5 (calc) (ref <5.0)
Triglycerides: 66 mg/dL (ref <150)

## 2018-11-17 LAB — TSH: TSH: 2.07 mIU/L (ref 0.40–4.50)

## 2019-02-21 ENCOUNTER — Ambulatory Visit (INDEPENDENT_AMBULATORY_CARE_PROVIDER_SITE_OTHER): Payer: 59 | Admitting: Internal Medicine

## 2019-02-21 ENCOUNTER — Other Ambulatory Visit: Payer: Self-pay

## 2019-02-21 DIAGNOSIS — B349 Viral infection, unspecified: Secondary | ICD-10-CM | POA: Diagnosis not present
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
## 2019-05-14 ENCOUNTER — Ambulatory Visit: Payer: 59

## 2019-05-24 ENCOUNTER — Telehealth: Payer: Self-pay

## 2019-05-24 NOTE — Telephone Encounter (Signed)
Patient is scheduled to have his first COVID vaccine this Friday 05/27/19. Patient would like to know if it okay to get the vaccine even though he had COVID in January of this year.

## 2019-05-24 NOTE — Telephone Encounter (Signed)
Patient advised and voiced understanding.  

## 2019-05-24 NOTE — Telephone Encounter (Signed)
Please advise, thanks.

## 2019-05-24 NOTE — Telephone Encounter (Signed)
Yes, by all means he should get the vaccine now.

## 2019-06-23 ENCOUNTER — Other Ambulatory Visit: Payer: Self-pay | Admitting: Family Medicine

## 2019-06-23 ENCOUNTER — Telehealth: Payer: Self-pay

## 2019-06-23 DIAGNOSIS — Z8279 Family history of other congenital malformations, deformations and chromosomal abnormalities: Secondary | ICD-10-CM

## 2019-06-23 NOTE — Progress Notes (Signed)
OK, referral ordered 

## 2019-06-23 NOTE — Telephone Encounter (Signed)
Patients wife called requesting referral for genetic testing for patient. Son with Ehlers-Danlos Syndrome.  Requesting referral to [facility] [phone], fx: [phone]).  FYI-Referral ordered for spouse at last OV, separate telephone encounter sent for change of referral.

## 2019-06-24 NOTE — Progress Notes (Signed)
Pt was called and told referral was placed

## 2020-05-23 ENCOUNTER — Other Ambulatory Visit: Payer: Self-pay

## 2020-05-24 ENCOUNTER — Ambulatory Visit (INDEPENDENT_AMBULATORY_CARE_PROVIDER_SITE_OTHER): Payer: 59 | Admitting: Family Medicine

## 2020-05-24 ENCOUNTER — Encounter: Payer: Self-pay | Admitting: Family Medicine

## 2020-05-24 VITALS — BP 95/59 | HR 60 | Temp 97.6°F | Resp 16 | Ht 73.75 in | Wt 190.8 lb

## 2020-05-24 DIAGNOSIS — R0600 Dyspnea, unspecified: Secondary | ICD-10-CM

## 2020-05-24 DIAGNOSIS — Z125 Encounter for screening for malignant neoplasm of prostate: Secondary | ICD-10-CM

## 2020-05-24 DIAGNOSIS — Z0000 Encounter for general adult medical examination without abnormal findings: Secondary | ICD-10-CM

## 2020-05-24 DIAGNOSIS — R109 Unspecified abdominal pain: Secondary | ICD-10-CM | POA: Diagnosis not present

## 2020-05-24 DIAGNOSIS — R0609 Other forms of dyspnea: Secondary | ICD-10-CM

## 2020-05-24 DIAGNOSIS — Z1211 Encounter for screening for malignant neoplasm of colon: Secondary | ICD-10-CM | POA: Diagnosis not present

## 2020-05-24 DIAGNOSIS — Z8249 Family history of ischemic heart disease and other diseases of the circulatory system: Secondary | ICD-10-CM

## 2020-05-24 LAB — CBC WITH DIFFERENTIAL/PLATELET
Basophils Absolute: 0.1 10*3/uL (ref 0.0–0.1)
Basophils Relative: 1.3 % (ref 0.0–3.0)
Eosinophils Absolute: 0.3 10*3/uL (ref 0.0–0.7)
Eosinophils Relative: 4.4 % (ref 0.0–5.0)
HCT: 43.7 % (ref 39.0–52.0)
Hemoglobin: 14.7 g/dL (ref 13.0–17.0)
Lymphocytes Relative: 29.8 % (ref 12.0–46.0)
Lymphs Abs: 1.9 10*3/uL (ref 0.7–4.0)
MCHC: 33.6 g/dL (ref 30.0–36.0)
MCV: 89.8 fl (ref 78.0–100.0)
Monocytes Absolute: 0.6 10*3/uL (ref 0.1–1.0)
Monocytes Relative: 9.6 % (ref 3.0–12.0)
Neutro Abs: 3.5 10*3/uL (ref 1.4–7.7)
Neutrophils Relative %: 54.9 % (ref 43.0–77.0)
Platelets: 249 10*3/uL (ref 150.0–400.0)
RBC: 4.86 Mil/uL (ref 4.22–5.81)
RDW: 14 % (ref 11.5–15.5)
WBC: 6.4 10*3/uL (ref 4.0–10.5)

## 2020-05-24 LAB — COMPREHENSIVE METABOLIC PANEL
ALT: 15 U/L (ref 0–53)
AST: 16 U/L (ref 0–37)
Albumin: 4.7 g/dL (ref 3.5–5.2)
Alkaline Phosphatase: 67 U/L (ref 39–117)
BUN: 23 mg/dL (ref 6–23)
CO2: 31 mEq/L (ref 19–32)
Calcium: 9.7 mg/dL (ref 8.4–10.5)
Chloride: 101 mEq/L (ref 96–112)
Creatinine, Ser: 1.24 mg/dL (ref 0.40–1.50)
GFR: 67.37 mL/min (ref >60.00)
Glucose, Bld: 92 mg/dL (ref 70–99)
Potassium: 4.4 mEq/L (ref 3.5–5.1)
Sodium: 137 mEq/L (ref 135–145)
Total Bilirubin: 0.6 mg/dL (ref 0.2–1.2)
Total Protein: 6.9 g/dL (ref 6.0–8.3)

## 2020-05-24 LAB — LIPID PANEL
Cholesterol: 152 mg/dL (ref 0–200)
HDL: 42.8 mg/dL (ref >39.00)
LDL Cholesterol: 99 mg/dL (ref 0–99)
NonHDL: 109.26
Total CHOL/HDL Ratio: 4
Triglycerides: 53 mg/dL (ref 0.0–149.0)
VLDL: 10.6 mg/dL (ref 0.0–40.0)

## 2020-05-24 LAB — PSA: PSA: 1.69 ng/mL (ref 0.10–4.00)

## 2020-05-24 LAB — TSH: TSH: 1.63 u[IU]/mL (ref 0.35–4.50)

## 2020-05-24 NOTE — Progress Notes (Signed)
Office Note 05/24/2020  CC:  Chief Complaint  Patient presents with  . Annual Exam    Fasting    HPI:  Arthur Allen is a 50 y.o. White male who is here for annual health maintenance exam. Doing well, busy work as Scientist, research (physical sciences). Walks and runs for exercise, eating pretty healthy.   Has intermittent "occasional" pain in RLQ, sounds like last on and off better part of a day at times, worse when running on treadmill.  No n/v.  No GI sx's.  No GU/urinary c/o.  No flank pain or blood in urine.  Also, he voices concern about his FH of CAD --2 men in his family had MI in 18s. Pt states he runs w/out signif dyspnea and has no cP or nausea or dizziness or palpitations or arm pain.  However, he notes he gets out of breath more if he climbs a flight of stairs.  It goes back to normal in < 30 sec.  He is concerned about CAD/heart in general and wants to get back with a cardiologist to see if any testing can be done---he saw Dr. Donnie Aho about 5 yrs ago and had normal ETT and coronary calcium score of zero.  Past Medical History:  Diagnosis Date  . Asthmatic bronchitis 2004   Lone episode--?mold exposure when working around a flooded house  . Chest wall pain   . 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  APPENDIX IN LLQ  . Family history of early CAD    F. sudden death age 59.  Carmelina Dane d of MI in his 75s.  ETT NORMAL 10/16/15; coronary calcium score of ZERO 2017.  Michaell Cowing hematuria 03/2018   -->Urol-->9 x 5 mm R upper uretal stone (MET trial)  . History of kidney stones 04/20/2018   ESWL for R prox uret cal    Past Surgical History:  Procedure Laterality Date  . CARDIOVASCULAR STRESS TEST  10/16/2015   NORMAL  . Coronary calcium score  2017   ZERO  . CT ABDOMEN PELVIS WO ([facility] HX)  03/31/2018   1)Moderate R hydronephrosis due to 46mm proximal right ureteral calculus. 2)Congenital malrotation of bowel incidentally noted. 3)Mildly enlarged prostate.   Marland Kitchen EXTRACORPOREAL SHOCK WAVE LITHOTRIPSY Right 04/15/2018   Procedure: EXTRACORPOREAL SHOCK WAVE LITHOTRIPSY (ESWL);  Surgeon: Heloise Purpura, MD;  Location: [facility];  Service: Urology;  Laterality: Right;  . MOLE REMOVAL     A few, benign.  Sees a dermatologist every few years.  . WISDOM TOOTH EXTRACTION  age 9 yrs    Family History  Problem Relation Age of Onset  . Cancer Mother   . Heart disease Father   . COPD Maternal Grandfather   . Stroke Paternal Grandmother   . Heart disease Paternal Grandfather     Social History   Socioeconomic History  . Marital status: Married    Spouse name: Not on file  . Number of children: Not on file  . Years of education: Not on file  . Highest education level: Not on file  Occupational History  . Not on file  Tobacco Use  . Smoking status: Never Smoker  . Smokeless tobacco: Never Used  Vaping Use  . Vaping Use: Never used  Substance and Sexual Activity  . Alcohol use: Yes    Comment: rarely  . Drug use: No  . Sexual activity: Not on file  Other Topics Concern  . Not on file  Social History Narrative  Married, 2 children (1 boy 1 girl).   Orig from [place] area of [place].   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   No tob, rare alcohol, no drugs.   Exercise 3X/week.   Social Determinants of Health   Financial Resource Strain: Not on file  Food Insecurity: Not on file  Transportation Needs: Not on file  Physical Activity: Not on file  Stress: Not on file  Social Connections: Not on file  Intimate Partner Violence: Not on file    Outpatient Medications Prior to Visit  Medication Sig Dispense Refill  . ASPIRIN 81 PO Take by mouth daily.    Marland Kitchen COENZYME Q10 PO Take by mouth daily.    . Multiple Vitamins-Minerals (MULTIVITAMIN ADULTS PO) Take by mouth daily.    . Omega-3 Fatty Acids (FISH OIL PO) Take by mouth daily.     No facility-administered medications  prior to visit.    Allergies  Allergen Reactions  . Penicillins     REACTION: as child    ROS Review of Systems  Constitutional: Negative for appetite change, chills, fatigue and fever.  HENT: Negative for congestion, dental problem, ear pain and sore throat.   Eyes: Negative for discharge, redness and visual disturbance.  Respiratory: Positive for shortness of breath (see hpi). Negative for cough, chest tightness and wheezing.   Cardiovascular: Negative for chest pain, palpitations and leg swelling.  Gastrointestinal: Positive for abdominal pain (see hpi). Negative for blood in stool, diarrhea, nausea and vomiting.  Genitourinary: Negative for difficulty urinating, dysuria, flank pain, frequency, hematuria and urgency.  Musculoskeletal: Negative for arthralgias, back pain, joint swelling, myalgias and neck stiffness.  Skin: Negative for pallor and rash.  Neurological: Negative for dizziness, speech difficulty, weakness and headaches.  Hematological: Negative for adenopathy. Does not bruise/bleed easily.  Psychiatric/Behavioral: Negative for confusion and sleep disturbance. The patient is not nervous/anxious.     PE; Vitals with BMI 05/24/2020 11/16/2018 04/15/2018  Height 6' 1.75" $Remove'6\' 2"'tOcQEET$  -  Weight 190 lbs 13 oz 183 lbs 10 oz -  BMI 75.64 33.29 -  Systolic 95 518 841  Diastolic 59 72 79  Pulse 60 64 57   Gen: Alert, well appearing.  Patient is oriented to person, place, time, and situation. AFFECT: pleasant, lucid thought and speech. ENT: Ears: EACs clear, normal epithelium.  TMs with good light reflex and landmarks bilaterally.  Eyes: no injection, icteris, swelling, or exudate.  EOMI, PERRLA. Nose: no drainage or turbinate edema/swelling.  No injection or focal lesion.  Mouth: lips without lesion/swelling.  Oral mucosa pink and moist.  Dentition intact and without obvious caries or gingival swelling.  Oropharynx without erythema, exudate, or swelling.  Neck: supple/nontender.  No  LAD, mass, or TM.  Carotid pulses 2+ bilaterally, without bruits. CV: RRR, no m/r/g.   LUNGS: CTA bilat, nonlabored resps, good aeration in all lung fields. ABD: soft, NT, ND, BS normal.  No hepatospenomegaly or mass.  No bruits. EXT: no clubbing, cyanosis, or edema.  Musculoskeletal: no joint swelling, erythema, warmth, or tenderness.  ROM of all joints intact. Skin - no sores or suspicious lesions or rashes or color changes   Pertinent labs:  Lab Results  Component Value Date   TSH 2.07 11/16/2018   Lab Results  Component Value Date   WBC 4.1 11/16/2018   HGB 14.1 11/16/2018   HCT 42.1 11/16/2018   MCV 88.6 11/16/2018   PLT 261 11/16/2018   Lab Results  Component Value Date   CREATININE 0.98 11/16/2018   BUN 15 11/16/2018   NA 140 11/16/2018   K 4.5 11/16/2018   CL 105 11/16/2018   CO2 26 11/16/2018   Lab Results  Component Value Date   ALT 13 11/16/2018   AST 17 11/16/2018   ALKPHOS 64 05/22/2015   BILITOT 0.4 11/16/2018   Lab Results  Component Value Date   CHOL 136 11/16/2018   Lab Results  Component Value Date   HDL 39 (L) 11/16/2018   Lab Results  Component Value Date   LDLCALC 82 11/16/2018   Lab Results  Component Value Date   TRIG 66 11/16/2018   Lab Results  Component Value Date   CHOLHDL 3.5 11/16/2018   Lab Results  Component Value Date   HGBA1C 5.4 05/24/2015   ASSESSMENT AND PLAN:   1) RLQ abd wall pain. Reassured.  2) FH early CAD + pt concern of DOE going up stairs. I reassured him today that nothing sounds signif pathologic but he is definitely worried overall about heart conditions in his family and wants to see cardiologist again so I ordered this referral.  3) Health maintenance exam: Reviewed age and gender appropriate health maintenance issues (prudent diet, regular exercise, health risks of tobacco and excessive alcohol, use of seatbelts, fire alarms in home, use of sunscreen).  Also reviewed age and gender appropriate  health screening as well as vaccine recommendations. Vaccines: Shingrix->#1 given today.  Otherwise UTD. Labs: fasting HP + PSA ordered. Prostate ca screening: PSA today. Colon ca screening: average risk patient= as per latest guidelines, start screening any time now->cologuard.   An After Visit Summary was printed and given to the patient.  FOLLOW UP:  No follow-ups on file.  Signed:  Crissie Sickles, MD           05/24/2020

## 2020-05-24 NOTE — Patient Instructions (Signed)

## 2020-07-17 ENCOUNTER — Ambulatory Visit (INDEPENDENT_AMBULATORY_CARE_PROVIDER_SITE_OTHER): Payer: 59 | Admitting: Cardiology

## 2020-07-17 ENCOUNTER — Other Ambulatory Visit: Payer: Self-pay

## 2020-07-17 ENCOUNTER — Encounter: Payer: Self-pay | Admitting: Cardiology

## 2020-07-17 VITALS — BP 118/64 | HR 70 | Ht 74.0 in | Wt 195.0 lb

## 2020-07-17 DIAGNOSIS — Z8249 Family history of ischemic heart disease and other diseases of the circulatory system: Secondary | ICD-10-CM | POA: Diagnosis not present

## 2020-07-17 NOTE — Addendum Note (Signed)
Addended by: Theresia Majors on: 07/17/2020 02:17 PM   Modules accepted: Orders

## 2020-07-17 NOTE — Patient Instructions (Signed)
Medication Instructions:  Your physician recommends that you continue on your current medications as directed. Please refer to the Current Medication list given to you today.  *If you need a refill on your cardiac medications before your next appointment, please call your pharmacy*  Testing/Procedures: Your physician has requested that you have an exercise tolerance test. For further information please visit [email or web address]. Please also follow instruction sheet, as given.  Your physician has requested that you have a calcium score CT scan.   Follow-Up: At [facility], you and your health needs are our priority.  As part of our continuing mission to provide you with exceptional heart care, we have created designated Provider Care Teams.  These Care Teams include your primary Cardiologist (physician) and Advanced Practice Providers (APPs -  Physician Assistants and Nurse Practitioners) who all work together to provide you with the care you need, when you need it.  Follow up with Dr. Turner as needed based on results of testing.   

## 2020-07-17 NOTE — Progress Notes (Signed)
Cardiology CONSULT Note    Date:  07/17/2020   ID:  Arthur Allen, DOB 12-11-1970, MRN [id number]  PCP:  Tammi Sou, MD  Cardiologist:  Fransico Him, MD   Chief Complaint  Patient presents with  . New Patient (Initial Visit)    Family history of premature CAD    History of Present Illness:  Arthur Allen is a 50 y.o. male who is being seen today for the evaluation of Family hx of CAD at the request of McGowen, Adrian Blackwater, MD.  This is a 50yo male with a hx of asthmatic bronchitis and fm hx of CAD with 2 men in his family having MIs in their 69's.  He was seen by Dr. Wynonia Lawman about 5 years ago and had a normal ETT and coronary Ca score of 0. He tells me that his father and grandfather died of MIs at 86-76 years of age and neither of them were smokers. The patient has never smoked.  He denies any chest pain or pressure, PND, orthopnea, LE edema, dizziness, palpitations or syncope.  He has noticed recently that he is more SOB when going up stairs but can run 4 miles without any problems.  He occasionally has some chest wall pain if he sleeps wrong on his back and has a hx of cervical spine issues.   He is compliant with his meds and is tolerating meds with no SE.    Past Medical History:  Diagnosis Date  . Asthmatic bronchitis 2004   Lone episode--?mold exposure when working around a flooded house  . Chest wall pain   . 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  APPENDIX IN LLQ  . Family history of early CAD    F. sudden death age 44.  Arthur Allen d of MI in his 14s.  ETT NORMAL 10/16/15; coronary calcium score of ZERO 2017.  Arthur Allen hematuria 03/2018   -->Urol-->9 x 5 mm R upper uretal stone (MET trial)  . History of kidney stones 04/20/2018   ESWL for R prox uret cal    Past Surgical History:  Procedure Laterality Date  . CARDIOVASCULAR STRESS TEST  10/16/2015   NORMAL  . Coronary calcium score  2017   ZERO  . CT ABDOMEN PELVIS WO  ([facility] HX)  03/31/2018   1)Moderate R hydronephrosis due to 32m proximal right ureteral calculus. 2)Congenital malrotation of bowel incidentally noted. 3)Mildly enlarged prostate.  .Marland KitchenEXTRACORPOREAL SHOCK WAVE LITHOTRIPSY Right 04/15/2018   Procedure: EXTRACORPOREAL SHOCK WAVE LITHOTRIPSY (ESWL);  Surgeon: BRaynelle Bring MD;  Location: [facility];  Service: Urology;  Laterality: Right;  . MOLE REMOVAL     A few, benign.  Sees a dermatologist every few years.  . WISDOM TOOTH EXTRACTION  age [age]yrs    Current Medications: Current Meds  Medication Sig  . ASPIRIN 81 PO Take by mouth daily.  .Marland KitchenCOENZYME Q10 PO Take by mouth daily.  . Multiple Vitamins-Minerals (MULTIVITAMIN ADULTS PO) Take by mouth daily.  . Omega-3 Fatty Acids (FISH OIL PO) Take by mouth daily.    Allergies:   Penicillins   Social History   Socioeconomic History  . Marital status: Married    Spouse name: Not on file  . Number of children: Not on file  . Years of education: Not on file  . Highest education level: Not on file  Occupational History  . Not on file  Tobacco Use  . Smoking status: Never Smoker  .  Smokeless tobacco: Never Used  Vaping Use  . Vaping Use: Never used  Substance and Sexual Activity  . Alcohol use: Yes    Comment: rarely  . Drug use: No  . Sexual activity: Not on file  Other Topics Concern  . Not on file  Social History Narrative   Married, 2 children (1 boy 1 girl).   Orig from [place] area of [place].   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   No tob, rare alcohol, no drugs.   Exercise 3X/week.   Social Determinants of Health   Financial Resource Strain: Not on file  Food Insecurity: Not on file  Transportation Needs: Not on file  Physical Activity: Not on file  Stress: Not on file  Social Connections: Not on file     Family History:  The patient's family history includes COPD in his maternal grandfather; Cancer  in his mother; Heart disease in his father and paternal grandfather; Stroke in his paternal grandmother.   ROS:   Please see the history of present illness.    ROS All other systems reviewed and are negative.    PHYSICAL EXAM:   VS:  BP 118/64   Pulse 70   Ht _0  (1.88 m)   Wt 195 lb (88.5 kg)   SpO2 96%   BMI 25.04 kg/m    GEN: Well nourished, well developed, in no acute distress  HEENT: normal  Neck: no JVD, carotid bruits, or masses Cardiac: RRR; no murmurs, rubs, or gallops,no edema.  Intact distal pulses bilaterally.  Respiratory:  clear to auscultation bilaterally, normal work of breathing GI: soft, nontender, nondistended, + BS MS: no deformity or atrophy  Skin: warm and dry, no rash Neuro:  Alert and Oriented x 3, Strength and sensation are intact Psych: euthymic mood, full affect  Wt Readings from Last 3 Encounters:  07/17/20 195 lb (88.5 kg)  05/24/20 190 lb 12.8 oz (86.5 kg)  11/16/18 183 lb 9.6 oz (83.3 kg)      Studies/Labs Reviewed:   EKG:  EKG is ordered today.  The ekg ordered today demonstrates NSR with no ST changes  Recent Labs: 05/24/2020: ALT 15; BUN 23; Creatinine, Ser 1.24; Hemoglobin 14.7; Platelets 249.0; Potassium 4.4; Sodium 137; TSH 1.63   Lipid Panel    Component Value Date/Time   CHOL 152 05/24/2020 0911   TRIG 53.0 05/24/2020 0911   HDL 42.80 05/24/2020 0911   CHOLHDL 4 05/24/2020 0911   VLDL 10.6 05/24/2020 0911   LDLCALC 99 05/24/2020 0911   LDLCALC 82 11/16/2018 0823   Additional studies/ records that were reviewed today include:  OV notes from PCP    ASSESSMENT:    1. Family history of premature CAD      PLAN:  In order of problems listed above:  1.  Family history of premature CAD -He is fairly asymptomatic.  At times he has DOE when walking up stairs but can run 4 miles with no problems -I have recommended repeating a coronary Ca score now that he is 48 to help assess future cardiac risk -I will also get an ETT  to rule out ischemia -Shared Decision Making/Informed Consent{ The risks [chest pain, shortness of breath, cardiac arrhythmias, dizziness, blood pressure fluctuations, myocardial infarction, stroke/transient ischemic attack, and life-threatening complications (estimated to be 1 in 10,000)], benefits (risk stratification, diagnosing coronary artery disease, treatment guidance) and alternatives of an exercise tolerance test were discussed in detail with Mr.  Arthur Allen and he agrees to proceed.   Medication Adjustments/Labs and Tests Ordered: Current medicines are reviewed at length with the patient today.  Concerns regarding medicines are outlined above.  Medication changes, Labs and Tests ordered today are listed in the Patient Instructions below.  There are no Patient Instructions on file for this visit.   Signed, Fransico Him, MD  07/17/2020 2:11 PM    [facility] [address], [place], [place]  [address] Phone: [phone]; Fax: [phone]

## 2020-07-18 NOTE — Addendum Note (Signed)
Addended by: Quintella Reichert on: 07/18/2020 08:59 AM   Modules accepted: Orders

## 2020-08-07 ENCOUNTER — Ambulatory Visit (INDEPENDENT_AMBULATORY_CARE_PROVIDER_SITE_OTHER): Payer: 59

## 2020-08-07 ENCOUNTER — Other Ambulatory Visit: Payer: Self-pay

## 2020-08-07 DIAGNOSIS — Z23 Encounter for immunization: Secondary | ICD-10-CM

## 2020-08-07 NOTE — Progress Notes (Signed)
Pt came today to receive second Shingrix vaccine. Given with no issues.

## 2020-08-16 ENCOUNTER — Other Ambulatory Visit: Payer: Self-pay

## 2020-08-16 ENCOUNTER — Encounter: Payer: Self-pay | Admitting: Family Medicine

## 2020-08-16 ENCOUNTER — Ambulatory Visit (INDEPENDENT_AMBULATORY_CARE_PROVIDER_SITE_OTHER): Payer: 59

## 2020-08-16 ENCOUNTER — Ambulatory Visit (INDEPENDENT_AMBULATORY_CARE_PROVIDER_SITE_OTHER)
Admission: RE | Admit: 2020-08-16 | Discharge: 2020-08-16 | Disposition: A | Discharge status: Home / Self Care | Payer: Self-pay | Source: Ambulatory Visit | Attending: Cardiology | Admitting: Cardiology

## 2020-08-16 DIAGNOSIS — R911 Solitary pulmonary nodule: Secondary | ICD-10-CM

## 2020-08-16 DIAGNOSIS — Z8249 Family history of ischemic heart disease and other diseases of the circulatory system: Secondary | ICD-10-CM

## 2020-08-16 HISTORY — DX: Solitary pulmonary nodule: R91.1

## 2020-08-16 HISTORY — PX: CARDIOVASCULAR STRESS TEST: SHX262

## 2020-08-16 HISTORY — PX: OTHER SURGICAL HISTORY: SHX169

## 2020-08-16 LAB — EXERCISE TOLERANCE TEST
Estimated workload: 15.3 METS
Exercise duration (min): 13 min
Exercise duration (sec): 0 s
MPHR: 169 {beats}/min
Peak HR: 162 {beats}/min
Percent HR: 95 %
RPE: 17
Rest HR: 57 {beats}/min

## 2021-05-23 DIAGNOSIS — Z87898 Personal history of other specified conditions: Secondary | ICD-10-CM | POA: Insufficient documentation

## 2021-05-28 ENCOUNTER — Ambulatory Visit (INDEPENDENT_AMBULATORY_CARE_PROVIDER_SITE_OTHER): Payer: 59 | Admitting: Family Medicine

## 2021-05-28 ENCOUNTER — Encounter: Payer: Self-pay | Admitting: Family Medicine

## 2021-05-28 VITALS — BP 107/68 | HR 62 | Temp 98.4°F | Ht 73.75 in | Wt 200.8 lb

## 2021-05-28 DIAGNOSIS — Z0000 Encounter for general adult medical examination without abnormal findings: Secondary | ICD-10-CM

## 2021-05-28 DIAGNOSIS — Z125 Encounter for screening for malignant neoplasm of prostate: Secondary | ICD-10-CM | POA: Diagnosis not present

## 2021-05-28 DIAGNOSIS — M79645 Pain in left finger(s): Secondary | ICD-10-CM

## 2021-05-28 DIAGNOSIS — Z1211 Encounter for screening for malignant neoplasm of colon: Secondary | ICD-10-CM | POA: Diagnosis not present

## 2021-05-28 LAB — COMPREHENSIVE METABOLIC PANEL
ALT: 17 U/L (ref 0–53)
AST: 17 U/L (ref 0–37)
Albumin: 4.5 g/dL (ref 3.5–5.2)
Alkaline Phosphatase: 62 U/L (ref 39–117)
BUN: 15 mg/dL (ref 6–23)
CO2: 29 mEq/L (ref 19–32)
Calcium: 9.2 mg/dL (ref 8.4–10.5)
Chloride: 103 mEq/L (ref 96–112)
Creatinine, Ser: 1.04 mg/dL (ref 0.40–1.50)
GFR: 82.61 mL/min (ref >60.00)
Glucose, Bld: 96 mg/dL (ref 70–99)
Potassium: 4.1 mEq/L (ref 3.5–5.1)
Sodium: 138 mEq/L (ref 135–145)
Total Bilirubin: 0.6 mg/dL (ref 0.2–1.2)
Total Protein: 6.6 g/dL (ref 6.0–8.3)

## 2021-05-28 LAB — LIPID PANEL
Cholesterol: 174 mg/dL (ref 0–200)
HDL: 40.3 mg/dL (ref >39.00)
LDL Cholesterol: 113 mg/dL — ABNORMAL HIGH (ref 0–99)
NonHDL: 134.06
Total CHOL/HDL Ratio: 4
Triglycerides: 106 mg/dL (ref 0.0–149.0)
VLDL: 21.2 mg/dL (ref 0.0–40.0)

## 2021-05-28 LAB — CBC WITH DIFFERENTIAL/PLATELET
Basophils Absolute: 0.1 10*3/uL (ref 0.0–0.1)
Basophils Relative: 1.9 % (ref 0.0–3.0)
Eosinophils Absolute: 0.3 10*3/uL (ref 0.0–0.7)
Eosinophils Relative: 4.9 % (ref 0.0–5.0)
HCT: 42.5 % (ref 39.0–52.0)
Hemoglobin: 14.2 g/dL (ref 13.0–17.0)
Lymphocytes Relative: 26.7 % (ref 12.0–46.0)
Lymphs Abs: 1.5 10*3/uL (ref 0.7–4.0)
MCHC: 33.5 g/dL (ref 30.0–36.0)
MCV: 90 fl (ref 78.0–100.0)
Monocytes Absolute: 0.6 10*3/uL (ref 0.1–1.0)
Monocytes Relative: 10.2 % (ref 3.0–12.0)
Neutro Abs: 3.2 10*3/uL (ref 1.4–7.7)
Neutrophils Relative %: 56.3 % (ref 43.0–77.0)
Platelets: 269 10*3/uL (ref 150.0–400.0)
RBC: 4.72 Mil/uL (ref 4.22–5.81)
RDW: 13.9 % (ref 11.5–15.5)
WBC: 5.7 10*3/uL (ref 4.0–10.5)

## 2021-05-28 LAB — PSA: PSA: 1.91 ng/mL (ref 0.10–4.00)

## 2021-05-28 LAB — TSH: TSH: 1.78 u[IU]/mL (ref 0.35–5.50)

## 2021-05-28 NOTE — Patient Instructions (Signed)

## 2021-05-28 NOTE — Progress Notes (Signed)
Office Note ?05/28/2021 ? ?CC:  ?Chief Complaint  ?Patient presents with  ? Annual Exam  ?  Pt is fasting  ? ? ?HPI:  ?Patient is a 51 y.o. male who is here for annual health maintenance exam. ?He is feeling well other than some left hand finger pain. ?Onset 3 to 4 months ago started noticing pain in the left index finger area near the MCP. ?No prior injury or strain recalled. ?Hurts to try to grip something, often pretty bad initial pain then it just fades away over time. ?No finger swelling.  No triggering ? ?Past Medical History:  ?Diagnosis Date  ? Asthmatic bronchitis 2004  ? Lone episode--?mold exposure when working around a flooded house  ? Chest wall pain   ? Congenital malrotation of intestine   ? incidentally noted on 03/2018.  APPENDIX IN LLQ  ? Family history of early CAD   ? F. sudden death age 14.  Terrial Rhodes d of MI in his 68s.  ETT NORMAL 10/16/15 and 6/3/0/22, coronary calcium score of ZERO 2017 and 2022.  ? Gross hematuria 03/2018  ? -->Urol-->9 x 5 mm R upper uretal stone (MET trial)  ? History of kidney stones 04/20/2018  ? ESWL for R prox uret cal  ? Pulmonary nodule 08/16/2020  ? stable 12mm on CT coronary ca imaging  ? ? ?Past Surgical History:  ?Procedure Laterality Date  ? CARDIOVASCULAR STRESS TEST  10/16/2015  ? NORMAL  ? CARDIOVASCULAR STRESS TEST  08/16/2020  ? ETT normal.  ? Coronary calcium score  2017  ? ZERO  ? CT ABDOMEN PELVIS WO ([facility])  03/31/2018  ? 1)Moderate R hydronephrosis due to 35mm proximal right ureteral calculus. 2)Congenital malrotation of bowel incidentally noted. 3)Mildly enlarged prostate.  ? CT cardiac scoring  08/16/2020  ? cor calc score ZERO in 2017 and 2022  ? EXTRACORPOREAL SHOCK WAVE LITHOTRIPSY Right 04/15/2018  ? Procedure: EXTRACORPOREAL SHOCK WAVE LITHOTRIPSY (ESWL);  Surgeon: Raynelle Bring, MD;  Location: [facility];  Service: Urology;  Laterality: Right;  ? MOLE REMOVAL    ? A few, benign.  Sees a dermatologist every few years.  ? WISDOM TOOTH EXTRACTION   age 73 yrs  ? ? ?Family History  ?Problem Relation Age of Onset  ? Cancer Mother   ? Heart disease Father   ? COPD Maternal Grandfather   ? Stroke Paternal Grandmother   ? Heart disease Paternal Grandfather   ? ? ?Social History  ? ?Socioeconomic History  ? Marital status: Married  ?  Spouse name: Not on file  ? Number of children: Not on file  ? Years of education: Not on file  ? Highest education level: Not on file  ?Occupational History  ? Not on file  ?Tobacco Use  ? Smoking status: Never  ? Smokeless tobacco: Never  ?Vaping Use  ? Vaping Use: Never used  ?Substance and Sexual Activity  ? Alcohol use: Yes  ?  Comment: rarely  ? Drug use: No  ? Sexual activity: Not on file  ?Other Topics Concern  ? Not on file  ?Social History Narrative  ? Married, 2 children (1 boy 1 girl).  ? Orig from [place] area of [place].  ? 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 Education: Masters Web designer.  ? No tob, rare alcohol, no drugs.  ? Exercise 3X/week.  ? ?Social Determinants of Health  ? ?Financial Resource Strain: Not on file  ?Food Insecurity: Not on file  ?  Transportation Needs: Not on file  ?Physical Activity: Not on file  ?Stress: Not on file  ?Social Connections: Not on file  ?Intimate Partner Violence: Not on file  ? ? ?Outpatient Medications Prior to Visit  ?Medication Sig Dispense Refill  ? ASPIRIN 81 PO Take by mouth daily.    ? COENZYME Q10 PO Take by mouth daily.    ? Multiple Vitamins-Minerals (MULTIVITAMIN ADULTS PO) Take by mouth daily.    ? Omega-3 Fatty Acids (FISH OIL PO) Take by mouth daily.    ? ?No facility-administered medications prior to visit.  ? ? ?Allergies  ?Allergen Reactions  ? Penicillins   ?  REACTION: as child  ? ? ?ROS ?Review of Systems  ?Constitutional:  Negative for appetite change, chills, fatigue and fever.  ?HENT:  Negative for congestion, dental problem, ear pain and sore throat.   ?Eyes:  Negative for discharge, redness and visual disturbance.   ?Respiratory:  Negative for cough, chest tightness, shortness of breath and wheezing.   ?Cardiovascular:  Negative for chest pain, palpitations and leg swelling.  ?Gastrointestinal:  Negative for abdominal pain, blood in stool, diarrhea, nausea and vomiting.  ?Genitourinary:  Negative for difficulty urinating, dysuria, flank pain, frequency, hematuria and urgency.  ?Musculoskeletal:  Negative for arthralgias, back pain, joint swelling, myalgias and neck stiffness.  ?Skin:  Negative for pallor and rash.  ?Neurological:  Negative for dizziness, speech difficulty, weakness and headaches.  ?Hematological:  Negative for adenopathy. Does not bruise/bleed easily.  ?Psychiatric/Behavioral:  Negative for confusion and sleep disturbance. The patient is not nervous/anxious.   ? ?PE; ? ?  05/28/2021  ?  8:05 AM 07/17/2020  ?  1:48 PM 05/24/2020  ?  8:34 AM  ?Vitals with BMI  ?Height 6' 1.75" $Remove'6\' 2"'[id number]$  6' 1.75"  ?Weight 200 lbs 13 oz 195 lbs 190 lbs 13 oz  ?BMI 25.96 25.03 24.67  ?Systolic 354 656 95  ?Diastolic 68 64 59  ?Pulse 62 70 60  ? ?Gen: Alert, well appearing.  Patient is oriented to person, place, time, and situation. ?AFFECT: pleasant, lucid thought and speech. ?ENT: Ears: EACs clear, normal epithelium.  TMs with good light reflex and landmarks bilaterally.  Eyes: no injection, icteris, swelling, or exudate.  EOMI, PERRLA. ?Nose: no drainage or turbinate edema/swelling.  No injection or focal lesion.  Mouth: lips without lesion/swelling.  Oral mucosa pink and moist.  Dentition intact and without obvious caries or gingival swelling.  Oropharynx without erythema, exudate, or swelling.  ?Neck: supple/nontender.  No LAD, mass, or TM.  Carotid pulses 2+ bilaterally, without bruits. ?CV: RRR, no m/r/g.   ?LUNGS: CTA bilat, nonlabored resps, good aeration in all lung fields. ?ABD: soft, NT, ND, BS normal.  No hepatospenomegaly or mass.  No bruits. ?EXT: no clubbing, cyanosis, or edema.  ?Left hand without joint erythema,  tenderness, or swelling.  Full range of motion of all fingers. ?Note nodularity around the joints or tendons.  No triggering.  He can do a full grip with normal strength. ?Musculoskeletal: no joint swelling, erythema, warmth, or tenderness.  ROM of all joints intact. ?Skin - no sores or suspicious lesions or rashes or color changes ? ?Pertinent labs:  ?Lab Results  ?Component Value Date  ? TSH 1.63 05/24/2020  ? ?Lab Results  ?Component Value Date  ? WBC 6.4 05/24/2020  ? HGB 14.7 05/24/2020  ? HCT 43.7 05/24/2020  ? MCV 89.8 05/24/2020  ? PLT 249.0 05/24/2020  ? ?Lab Results  ?Component Value Date  ?  CREATININE 1.24 05/24/2020  ? BUN 23 05/24/2020  ? NA 137 05/24/2020  ? K 4.4 05/24/2020  ? CL 101 05/24/2020  ? CO2 31 05/24/2020  ? ?Lab Results  ?Component Value Date  ? ALT 15 05/24/2020  ? AST 16 05/24/2020  ? ALKPHOS 67 05/24/2020  ? BILITOT 0.6 05/24/2020  ? ?Lab Results  ?Component Value Date  ? CHOL 152 05/24/2020  ? ?Lab Results  ?Component Value Date  ? HDL 42.80 05/24/2020  ? ?Lab Results  ?Component Value Date  ? [place] 99 05/24/2020  ? ?Lab Results  ?Component Value Date  ? TRIG 53.0 05/24/2020  ? ?Lab Results  ?Component Value Date  ? CHOLHDL 4 05/24/2020  ? ?Lab Results  ?Component Value Date  ? PSA 1.69 05/24/2020  ? ?Lab Results  ?Component Value Date  ? HGBA1C 5.4 05/24/2015  ? ?ASSESSMENT AND PLAN:  ? ?#1 left hand index finger pain. ?No preceding strain or trauma.  Exam normal today. ?(Bedside ultrasound showed no abnormality of the tendons of index finger or MCP joint. ?No cystic structure or other mass was seen). ?Unknown etiology of this pain--will refer to hand surgeon for further evaluation.   ? ?#2 Health maintenance exam: ?Reviewed age and gender appropriate health maintenance issues (prudent diet, regular exercise, health risks of tobacco and excessive alcohol, use of seatbelts, fire alarms in home, use of sunscreen).  Also reviewed age and gender appropriate health screening as well as  vaccine recommendations. ?Vaccines:  ALL UTD. ?Labs: fasting HP labs + PSA ordered ?Prostate ca screening: PSA today. ?Colon ca screening: due for initial screening--> Cologuard ordered. ? ?An After Visit Summary was prin

## 2021-07-25 ENCOUNTER — Telehealth: Payer: Self-pay

## 2021-07-25 NOTE — Telephone Encounter (Signed)
Encouraged pt to complete cologuard kit received in April.

## 2021-12-17 IMAGING — CT CT CARDIAC CORONARY ARTERY CALCIUM SCORE
3 series · 14 of 20 positions shown, 15 images · non-contrast
Comparison: Prior calcium score study on 10/17/2015
COMPARISON: Prior calcium score study on 10/17/2015

Addendum:
EXAM:
OVER-READ INTERPRETATION  CT CHEST

The following report is an over-read performed by radiologist Dr.
Hidetugu Tachi [REDACTED] on 08/16/2020. This
over-read does not include interpretation of cardiac or coronary
anatomy or pathology. The coronary calcium score interpretation by
the cardiologist is attached.
CLINICAL DATA: Risk stratification: 49 Year-old White Male
Coronary Calcium Score
TECHNIQUE: The patient was scanned on a Siemens Force scanner. Axial
non-contrast 3 mm slices were carried out through the heart. The
data set was analyzed on a dedicated work station and scored using
the Agatson method.

[Series 2: casc 3.0 bv41 2 bestdiast 71 % · axial · 0.43mm/px · z∈[-261,-171]mm · 4 of 51 slices shown, 5 images]
[im 11/51  vessel]
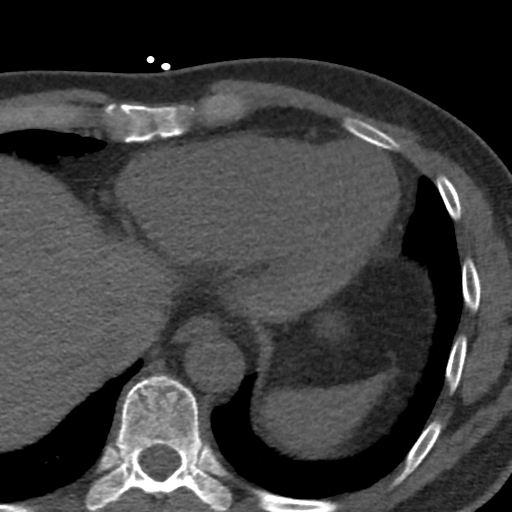
[im 11/51  lung]
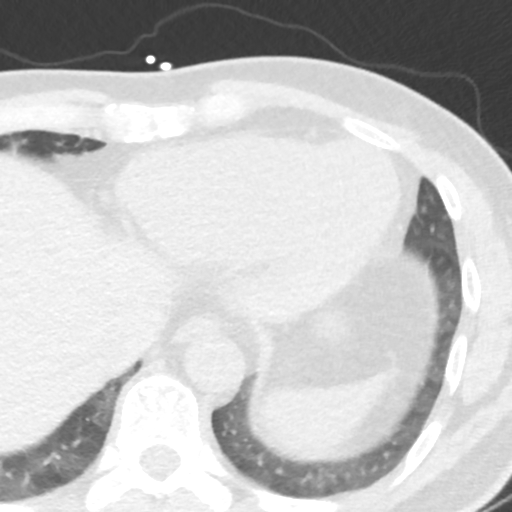
[im 21/51  vessel]
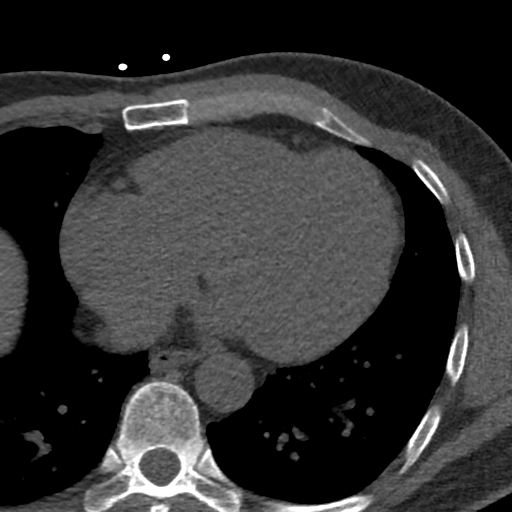
[im 31/51  vessel]
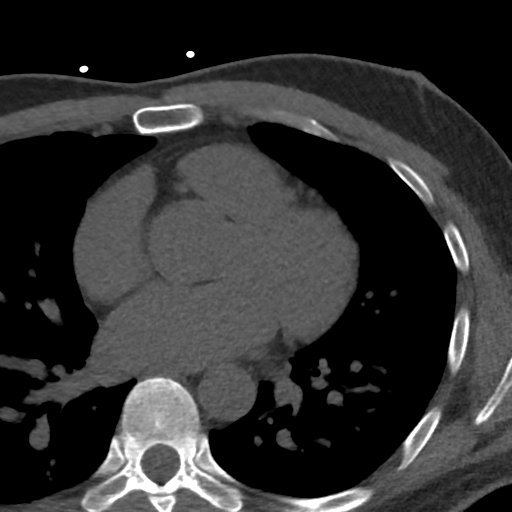
[im 41/51  vessel]
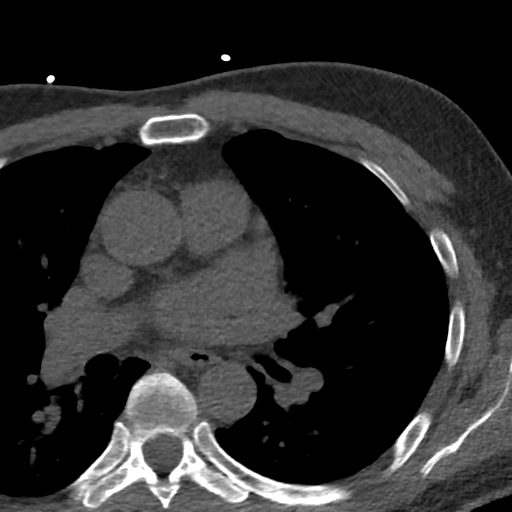

[Series 3: lung 71 % · axial · 0.74mm/px · z∈[-267,-168]mm · 5 of 51 slices shown]
[im 9/51  lung]
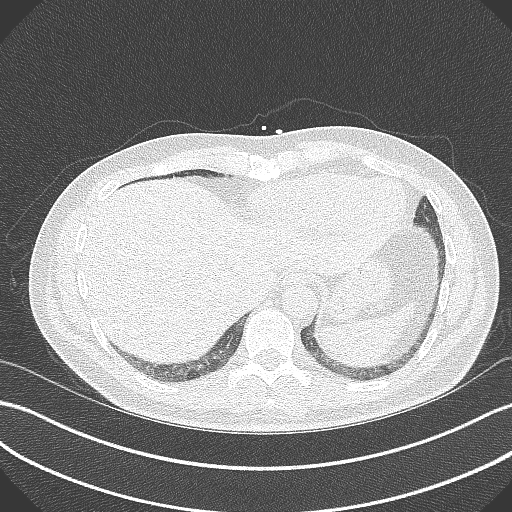
[im 17/51  lung]
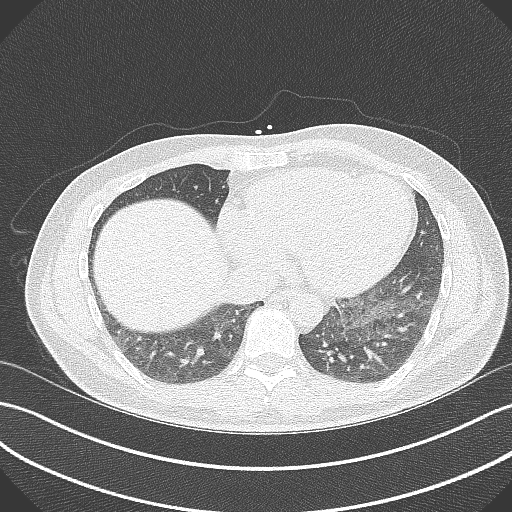
[im 26/51  lung]
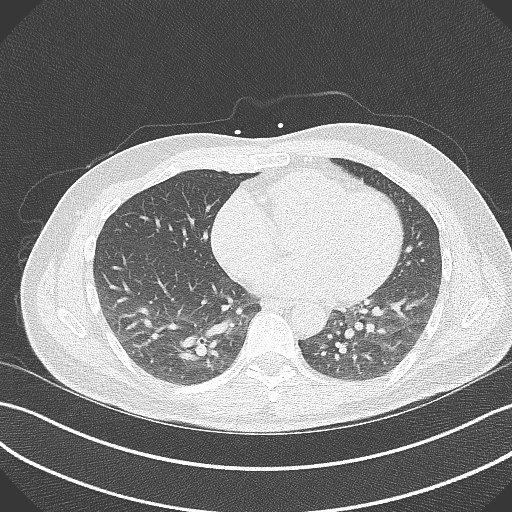
[im 34/51  lung]
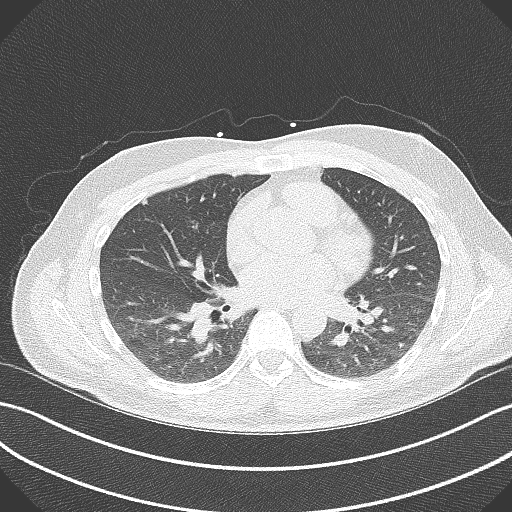
[im 42/51  lung]
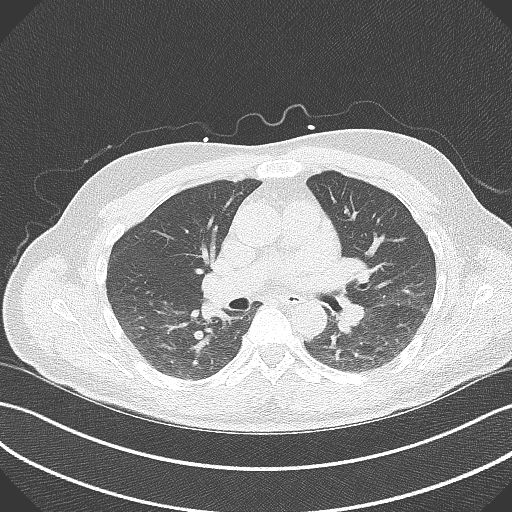

[Series 4: lung st 71 % · axial · 0.74mm/px · z∈[-267,-168]mm · 5 of 51 slices shown]
[im 9/51  lung]
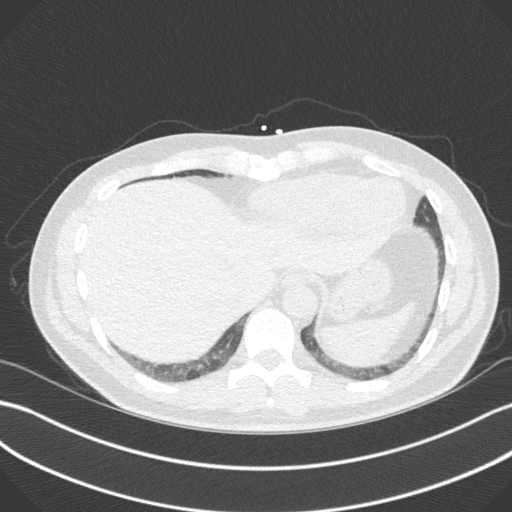
[im 17/51  lung]
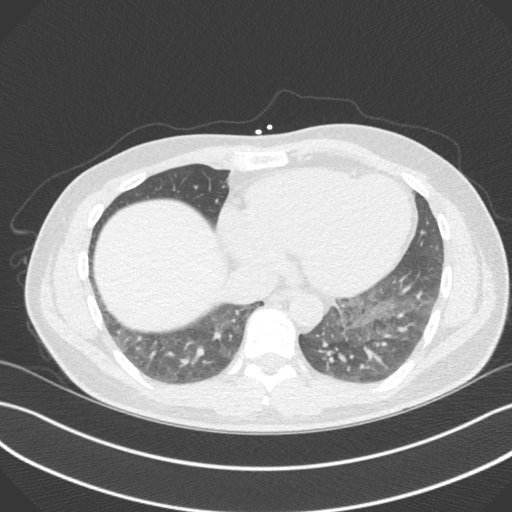
[im 26/51  lung]
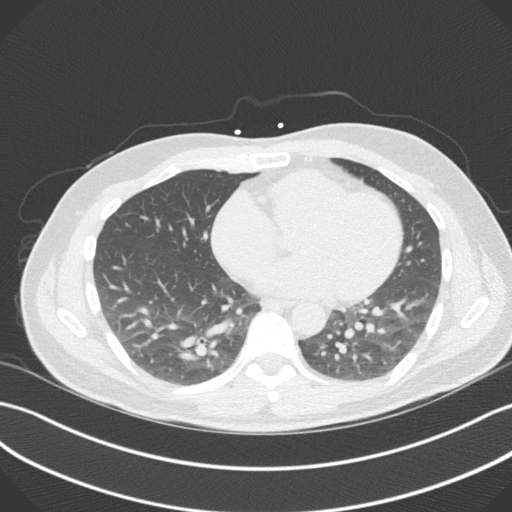
[im 34/51  lung]
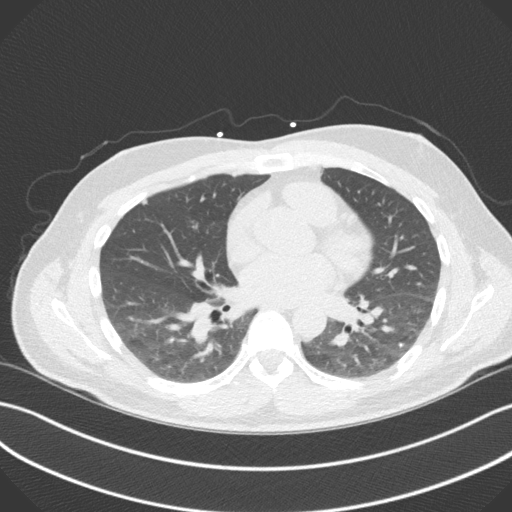
[im 42/51  lung]
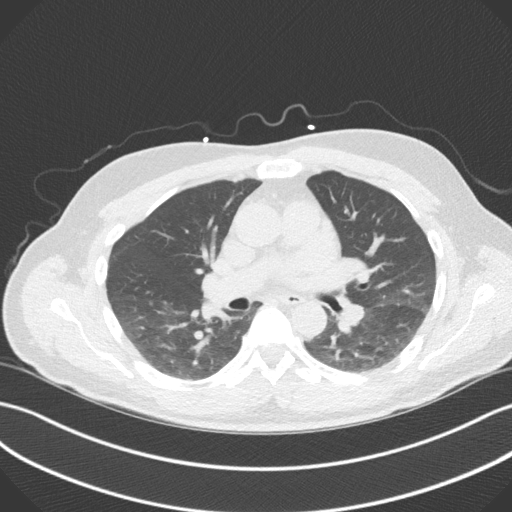

[14 of 20 positions shown; findings below may reference images not displayed]

FINDINGS: Vascular: No significant noncardiac vascular findings.

Mediastinum/Nodes: Visualized mediastinum and hilar regions
demonstrate no lymphadenopathy or masses.

Lungs/Pleura: Stable 5 mm nodule along the right major fissure.
Visualized lungs show no evidence of pulmonary edema, consolidation,
pneumothorax or pleural fluid. 3-4 mm nodule at the posterior right
lung base nearly at the diaphragmatic surface is less well
visualized and appears stable.

Upper Abdomen: No acute abnormality.

Musculoskeletal: No chest wall mass or suspicious bone lesions
identified.
IMPRESSION: 5 mm nodule along the right major fissure and 3-4 mm nodule at the
diaphragmatic surface of the right lower lobe are stable since 3331
and therefore benign.
FINDINGS: Non-cardiac: See separate report from [REDACTED].

Ascending Aorta: Normal caliber.

Pericardium: Normal.

Coronary arteries: Normal origins.

Coronary Calcium Score:

Left main: 0

Left anterior descending artery: 0

Left circumflex artery: 0

Right coronary artery: 0

Total: 0

Percentile: 1st for age, sex, and race matched control.
IMPRESSION: 1. Coronary calcium score of 0. This was 1st percentile for age,
gender, and race matched controls.

2. No significant change in cardiac structures from 10/17/2015 study.

RECOMMENDATIONS:



If CAC = 0, it is reasonable to withhold statin therapy and reassess
in 5 to 10 years, as long as higher risk conditions are absent
(diabetes mellitus, family history of premature CHD in first degree
relatives (males <55 years; females <65 years), cigarette smoking,
LDL >=190 mg/dL or other independent risk factors).

If CAC is 1 to 99, it is reasonable to initiate statin therapy for
patients ?55 years of age.

If CAC is >=100 or >=75th percentile, it is reasonable to initiate
statin therapy at any age.

Cardiology referral should be considered for patients with CAC
scores =400 or >=75th percentile.

*0629 AHA/ACC/AACVPR/AAPA/ABC/KAYY/JIM/GOLSTEINIENE/Jeske/VERDELL/FUENTES ANDINO/FLAVIA ANDREA
Guideline on the Management of Blood Cholesterol: A Report of the
American College of Cardiology/American Heart Association Task Force
on Clinical Practice Guidelines. J Am Coll Cardiol.
3014;73(24):5501-5806.

*** End of Addendum ***
EXAM:
OVER-READ INTERPRETATION  CT CHEST

The following report is an over-read performed by radiologist Dr.
Hidetugu Tachi [REDACTED] on 08/16/2020. This
over-read does not include interpretation of cardiac or coronary
anatomy or pathology. The coronary calcium score interpretation by
the cardiologist is attached.
FINDINGS: Vascular: No significant noncardiac vascular findings.

Mediastinum/Nodes: Visualized mediastinum and hilar regions
demonstrate no lymphadenopathy or masses.

Lungs/Pleura: Stable 5 mm nodule along the right major fissure.
Visualized lungs show no evidence of pulmonary edema, consolidation,
pneumothorax or pleural fluid. 3-4 mm nodule at the posterior right
lung base nearly at the diaphragmatic surface is less well
visualized and appears stable.

Upper Abdomen: No acute abnormality.

Musculoskeletal: No chest wall mass or suspicious bone lesions
identified.
IMPRESSION: 5 mm nodule along the right major fissure and 3-4 mm nodule at the
diaphragmatic surface of the right lower lobe are stable since 3331
and therefore benign.

## 2022-03-24 ENCOUNTER — Encounter: Payer: Self-pay | Admitting: Family Medicine

## 2022-05-19 ENCOUNTER — Other Ambulatory Visit: Payer: Self-pay | Admitting: Family Medicine

## 2022-05-30 ENCOUNTER — Encounter: Payer: Self-pay | Admitting: Family Medicine

## 2022-06-05 NOTE — Patient Instructions (Signed)
Have a great day! You will receive a call or mychart message regarding your results.  Health Maintenance, Male Adopting a healthy lifestyle and getting preventive care are important in promoting health and wellness. Ask your health care provider about: The right schedule for you to have regular tests and exams. Things you can do on your own to prevent diseases and keep yourself healthy. What should I know about diet, weight, and exercise? Eat a healthy diet  Eat a diet that includes plenty of vegetables, fruits, low-fat dairy products, and lean protein. Do not eat a lot of foods that are high in solid fats, added sugars, or sodium. Maintain a healthy weight Body mass index (BMI) is a measurement that can be used to identify possible weight problems. It estimates body fat based on height and weight. Your health care provider can help determine your BMI and help you achieve or maintain a healthy weight. Get regular exercise Get regular exercise. This is one of the most important things you can do for your health. Most adults should: Exercise for at least 150 minutes each week. The exercise should increase your heart rate and make you sweat (moderate-intensity exercise). Do strengthening exercises at least twice a week. This is in addition to the moderate-intensity exercise. Spend less time sitting. Even light physical activity can be beneficial. Watch cholesterol and blood lipids Have your blood tested for lipids and cholesterol at 54 years of age, then have this test every 5 years. You may need to have your cholesterol levels checked more often if: Your lipid or cholesterol levels are high. You are older than 54 years of age. You are at high risk for heart disease. What should I know about cancer screening? Many types of cancers can be detected early and may often be prevented. Depending on your health history and family history, you may need to have cancer screening at various ages. This  may include screening for: Colorectal cancer. Prostate cancer. Skin cancer. Lung cancer. What should I know about heart disease, diabetes, and high blood pressure? Blood pressure and heart disease High blood pressure causes heart disease and increases the risk of stroke. This is more likely to develop in people who have high blood pressure readings or are overweight. Talk with your health care provider about your target blood pressure readings. Have your blood pressure checked: Every 3-5 years if you are 43-20 years of age. Every year if you are 90 years old or older. If you are between the ages of 33 and 35 and are a current or former smoker, ask your health care provider if you should have a one-time screening for abdominal aortic aneurysm (AAA). Diabetes Have regular diabetes screenings. This checks your fasting blood sugar level. Have the screening done: Once every three years after age 70 if you are at a normal weight and have a low risk for diabetes. More often and at a younger age if you are overweight or have a high risk for diabetes. What should I know about preventing infection? Hepatitis B If you have a higher risk for hepatitis B, you should be screened for this virus. Talk with your health care provider to find out if you are at risk for hepatitis B infection. Hepatitis C Blood testing is recommended for: Everyone born from [birth year] through 1967. Anyone with known risk factors for hepatitis C. Sexually transmitted infections (STIs) You should be screened each year for STIs, including gonorrhea and chlamydia, if: You are sexually active and are  younger than 54 years of age. You are older than 54 years of age and your health care provider tells you that you are at risk for this type of infection. Your sexual activity has changed since you were last screened, and you are at increased risk for chlamydia or gonorrhea. Ask your health care provider if you are at risk. Ask your health  care provider about whether you are at high risk for HIV. Your health care provider may recommend a prescription medicine to help prevent HIV infection. If you choose to take medicine to prevent HIV, you should first get tested for HIV. You should then be tested every 3 months for as long as you are taking the medicine. Follow these instructions at home: Alcohol use Do not drink alcohol if your health care provider tells you not to drink. If you drink alcohol: Limit how much you have to 0-2 drinks a day. Know how much alcohol is in your drink. In the [place] one drink equals one 12 oz bottle of beer (355 mL), one 5 oz glass of wine (148 mL), or one 1 oz glass of hard liquor (44 mL). Lifestyle Do not use any products that contain nicotine or tobacco. These products include cigarettes, chewing tobacco, and vaping devices, such as e-cigarettes. If you need help quitting, ask your health care provider. Do not use street drugs. Do not share needles. Ask your health care provider for help if you need support or information about quitting drugs. General instructions Schedule regular health, dental, and eye exams. Stay current with your vaccines. Tell your health care provider if: You often feel depressed. You have ever been abused or do not feel safe at home. Summary Adopting a healthy lifestyle and getting preventive care are important in promoting health and wellness. Follow your health care provider's instructions about healthy diet, exercising, and getting tested or screened for diseases. Follow your health care provider's instructions on monitoring your cholesterol and blood pressure. This information is not intended to replace advice given to you by your health care provider. Make sure you discuss any questions you have with your health care provider. Document Revised: 06/25/2020 Document Reviewed: 06/25/2020 Elsevier Patient Education  2023 [employer].

## 2022-06-06 ENCOUNTER — Encounter: Payer: Self-pay | Admitting: Family Medicine

## 2022-06-06 ENCOUNTER — Ambulatory Visit (INDEPENDENT_AMBULATORY_CARE_PROVIDER_SITE_OTHER): Payer: No Typology Code available for payment source | Admitting: Family Medicine

## 2022-06-06 VITALS — BP 118/80 | HR 60 | Ht 74.0 in | Wt 201.2 lb

## 2022-06-06 DIAGNOSIS — Z0000 Encounter for general adult medical examination without abnormal findings: Secondary | ICD-10-CM

## 2022-06-06 DIAGNOSIS — Z125 Encounter for screening for malignant neoplasm of prostate: Secondary | ICD-10-CM | POA: Diagnosis not present

## 2022-06-06 NOTE — Progress Notes (Signed)
Office Note 06/06/2022  CC:  Chief Complaint  Patient presents with   Annual Exam    Fasting CPE    HPI:  Patient is a 52 y.o. male who is here for annual health maintenance exam. Says things are going fine.  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 He states he has some mild symptoms of plantar fasciitis bilaterally, wears heel cups lately. Also has some mild intermittent pain in his neck.  Past Medical History:  Diagnosis Date   Asthmatic bronchitis 2004   Lone episode--?mold exposure when working around a flooded house   Chest wall pain    Congenital malrotation of intestine    incidentally noted on 03/2018.  APPENDIX IN LLQ   Family history of early CAD    F. sudden death age 85.  Carmelina Dane d of MI in his 62s.  ETT NORMAL 10/16/15 and 6/3/0/22, coronary calcium score of ZERO 2017 and 2022.   Gross hematuria 03/2018   -->Urol-->9 x 5 mm R upper uretal stone (MET trial)   History of kidney stones 04/20/2018   ESWL for R prox uret cal   Pulmonary nodule 08/16/2020   stable 5mm on CT coronary ca imaging    Past Surgical History:  Procedure Laterality Date   CARDIOVASCULAR STRESS TEST  10/16/2015   NORMAL   CARDIOVASCULAR STRESS TEST  08/16/2020   ETT normal.   Coronary calcium score  2017   ZERO   CT ABDOMEN PELVIS WO ([facility] HX)  03/31/2018   1)Moderate R hydronephrosis due to 5mm proximal right ureteral calculus. 2)Congenital malrotation of bowel incidentally noted. 3)Mildly enlarged prostate.   CT cardiac scoring  08/16/2020   cor calc score ZERO in 2017 and 2022   EXTRACORPOREAL SHOCK WAVE LITHOTRIPSY Right 04/15/2018   Procedure: EXTRACORPOREAL SHOCK WAVE LITHOTRIPSY (ESWL);  Surgeon: Heloise Purpura, MD;  Location: [facility];  Service: Urology;  Laterality: Right;   MOLE REMOVAL     A few, benign.  Sees a dermatologist every few years.   WISDOM TOOTH EXTRACTION  age 22 yrs    Family History  Problem Relation Age of Onset   Cancer Mother    Heart disease Father     COPD Maternal Grandfather    Stroke Paternal Grandmother    Heart disease Paternal Grandfather     Social History   Socioeconomic History   Marital status: Married    Spouse name: Not on file   Number of children: Not on file   Years of education: Not on file   Highest education level: Not on file  Occupational History   Not on file  Tobacco Use   Smoking status: Never   Smokeless tobacco: Never  Vaping Use   Vaping Use: Never used  Substance and Sexual Activity   Alcohol use: Yes    Comment: rarely   Drug use: No   Sexual activity: Not on file  Other Topics Concern   Not on file  Social History Narrative   Married, 2 children (1 boy 1 girl).   Orig from [place] area of [place].   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   No tob, rare alcohol, no drugs.   Exercise 3X/week.   Social Determinants of Health   Financial Resource Strain: Not on file  Food Insecurity: Not on file  Transportation Needs: Not on file  Physical Activity: Not on file  Stress: Not on file  Social Connections: Not on file  Intimate Partner Violence: Not on file    Outpatient Medications Prior to Visit  Medication Sig Dispense Refill   ASPIRIN 81 PO Take by mouth daily.     COENZYME Q10 PO Take by mouth daily.     Multiple Vitamins-Minerals (MULTIVITAMIN ADULTS PO) Take by mouth daily.     Omega-3 Fatty Acids (FISH OIL PO) Take by mouth daily.     No facility-administered medications prior to visit.    Allergies  Allergen Reactions   Penicillins     REACTION: as child    Review of Systems  Constitutional:  Negative for appetite change, chills, fatigue and fever.  HENT:  Negative for congestion, dental problem, ear pain and sore throat.   Eyes:  Negative for discharge, redness and visual disturbance.  Respiratory:  Negative for cough, chest tightness, shortness of breath and wheezing.   Cardiovascular:  Negative for  chest pain, palpitations and leg swelling.  Gastrointestinal:  Negative for abdominal pain, blood in stool, diarrhea, nausea and vomiting.  Genitourinary:  Negative for difficulty urinating, dysuria, flank pain, frequency, hematuria and urgency.  Musculoskeletal:  Negative for arthralgias, back pain, joint swelling, myalgias and neck stiffness.  Skin:  Negative for pallor and rash.  Neurological:  Negative for dizziness, speech difficulty, weakness and headaches.  Hematological:  Negative for adenopathy. Does not bruise/bleed easily.  Psychiatric/Behavioral:  Negative for confusion and sleep disturbance. The patient is not nervous/anxious.     PE;    06/06/2022    3:48 PM 05/28/2021    8:05 AM 07/17/2020    1:48 PM  Vitals with BMI  Height  6' 1.75"   Weight 201 lbs 3 oz 200 lbs 13 oz 195 lbs  BMI 25.82 25.96 25.03  Systolic 118 107 161  Diastolic 80 68 64  Pulse 60 62 70    Gen: Alert, well appearing.  Patient is oriented to person, place, time, and situation. AFFECT: pleasant, lucid thought and speech. ENT: Ears: EACs clear, normal epithelium.  TMs with good light reflex and landmarks bilaterally.  Eyes: no injection, icteris, swelling, or exudate.  EOMI, PERRLA. Nose: no drainage or turbinate edema/swelling.  No injection or focal lesion.  Mouth: lips without lesion/swelling.  Oral mucosa pink and moist.  Dentition intact and without obvious caries or gingival swelling.  Oropharynx without erythema, exudate, or swelling.  Neck: supple/nontender.  No LAD, mass, or TM.  Carotid pulses 2+ bilaterally, without bruits. CV: RRR, no m/r/g.   LUNGS: CTA bilat, nonlabored resps, good aeration in all lung fields. ABD: soft, NT, ND, BS normal.  No hepatospenomegaly or mass.  No bruits. EXT: no clubbing, cyanosis, or edema.  Musculoskeletal: no joint swelling, erythema, warmth, or tenderness.  ROM of all joints intact. Skin - no sores or suspicious lesions or rashes or color  changes  Pertinent labs:  Lab Results  Component Value Date   TSH 1.78 05/28/2021   Lab Results  Component Value Date   WBC 5.7 05/28/2021   HGB 14.2 05/28/2021   HCT 42.5 05/28/2021   MCV 90.0 05/28/2021   PLT 269.0 05/28/2021   Lab Results  Component Value Date   CREATININE 1.04 05/28/2021   BUN 15 05/28/2021   NA 138 05/28/2021   K 4.1 05/28/2021   CL 103 05/28/2021   CO2 29 05/28/2021   Lab Results  Component Value Date   ALT 17 05/28/2021   AST 17 05/28/2021   ALKPHOS 62 05/28/2021   BILITOT 0.6 05/28/2021  Lab Results  Component Value Date   CHOL 174 05/28/2021   Lab Results  Component Value Date   HDL 40.30 05/28/2021   Lab Results  Component Value Date   LDLCALC 113 (H) 05/28/2021   Lab Results  Component Value Date   TRIG 106.0 05/28/2021   Lab Results  Component Value Date   CHOLHDL 4 05/28/2021   Lab Results  Component Value Date   PSA 1.91 05/28/2021   PSA 1.69 05/24/2020   Lab Results  Component Value Date   HGBA1C 5.4 05/24/2015   ASSESSMENT AND PLAN:   Health maintenance exam: Reviewed age and gender appropriate health maintenance issues (prudent diet, regular exercise, health risks of tobacco and excessive alcohol, use of seatbelts, fire alarms in home, use of sunscreen).  Also reviewed age and gender appropriate health screening as well as vaccine recommendations. Vaccines:  ALL UTD. Labs: fasting HP labs + PSA ordered Prostate ca screening: PSA today. Colon ca screening: earlier this year cologuard--patient did not get any results from the company and we have nothing in the chart.  We will track this down.Marland Kitchen  An After Visit Summary was printed and given to the patient.  FOLLOW UP:  Return in about 1 year (around 06/06/2023) for annual CPE (fasting).  Signed:  Santiago Bumpers, MD           06/06/2022

## 2022-06-07 LAB — COMPREHENSIVE METABOLIC PANEL
AG Ratio: 2.1 (calc) (ref 1.0–2.5)
ALT: 14 U/L (ref 9–46)
AST: 17 U/L (ref 10–35)
Albumin: 4.8 g/dL (ref 3.6–5.1)
Alkaline phosphatase (APISO): 67 U/L (ref 35–144)
BUN: 12 mg/dL (ref 7–25)
CO2: 25 mmol/L (ref 20–32)
Calcium: 9.8 mg/dL (ref 8.6–10.3)
Chloride: 103 mmol/L (ref 98–110)
Creat: 1.13 mg/dL (ref 0.70–1.30)
Globulin: 2.3 g/dL (calc) (ref 1.9–3.7)
Glucose, Bld: 87 mg/dL (ref 65–99)
Potassium: 3.9 mmol/L (ref 3.5–5.3)
Sodium: 138 mmol/L (ref 135–146)
Total Bilirubin: 0.7 mg/dL (ref 0.2–1.2)
Total Protein: 7.1 g/dL (ref 6.1–8.1)

## 2022-06-07 LAB — CBC WITH DIFFERENTIAL/PLATELET
Absolute Monocytes: 624 cells/uL (ref 200–950)
Basophils Absolute: 90 cells/uL (ref 0–200)
Basophils Relative: 1.5 %
Eosinophils Absolute: 252 cells/uL (ref 15–500)
Eosinophils Relative: 4.2 %
HCT: 44.9 % (ref 38.5–50.0)
Hemoglobin: 15.4 g/dL (ref 13.2–17.1)
Lymphs Abs: 1914 cells/uL (ref 850–3900)
MCH: 30 pg (ref 27.0–33.0)
MCHC: 34.3 g/dL (ref 32.0–36.0)
MCV: 87.4 fL (ref 80.0–100.0)
MPV: 11.3 fL (ref 7.5–12.5)
Monocytes Relative: 10.4 %
Neutro Abs: 3120 cells/uL (ref 1500–7800)
Neutrophils Relative %: 52 %
Platelets: 324 10*3/uL (ref 140–400)
RBC: 5.14 10*6/uL (ref 4.20–5.80)
RDW: 12.7 % (ref 11.0–15.0)
Total Lymphocyte: 31.9 %
WBC: 6 10*3/uL (ref 3.8–10.8)

## 2022-06-07 LAB — LIPID PANEL
Cholesterol: 167 mg/dL (ref <200)
HDL: 40 mg/dL (ref >=40)
LDL Cholesterol (Calc): 110 mg/dL (calc) — ABNORMAL HIGH
Non-HDL Cholesterol (Calc): 127 mg/dL (calc) (ref <130)
Total CHOL/HDL Ratio: 4.2 (calc) (ref <5.0)
Triglycerides: 84 mg/dL (ref <150)

## 2022-06-07 LAB — TSH: TSH: 2.39 mIU/L (ref 0.40–4.50)

## 2022-06-07 LAB — PSA: PSA: 2.48 ng/mL (ref <=4.00)

## 2022-06-09 ENCOUNTER — Encounter: Payer: Self-pay | Admitting: Family Medicine

## 2022-11-15 LAB — COLOGUARD: COLOGUARD: NEGATIVE

## 2022-11-16 ENCOUNTER — Encounter: Payer: Self-pay | Admitting: Family Medicine

## 2023-06-16 ENCOUNTER — Encounter: Payer: Self-pay | Admitting: Family Medicine

## 2023-06-16 ENCOUNTER — Ambulatory Visit (INDEPENDENT_AMBULATORY_CARE_PROVIDER_SITE_OTHER): Payer: No Typology Code available for payment source | Admitting: Family Medicine

## 2023-06-16 VITALS — BP 97/70 | HR 55 | Temp 98.5°F | Ht 74.0 in | Wt 190.6 lb

## 2023-06-16 DIAGNOSIS — Z0000 Encounter for general adult medical examination without abnormal findings: Secondary | ICD-10-CM | POA: Diagnosis not present

## 2023-06-16 DIAGNOSIS — Z125 Encounter for screening for malignant neoplasm of prostate: Secondary | ICD-10-CM

## 2023-06-16 LAB — CBC
HCT: 42.5 % (ref 38.5–50.0)
Hemoglobin: 14.3 g/dL (ref 13.2–17.1)
MCH: 30.6 pg (ref 27.0–33.0)
MCHC: 33.6 g/dL (ref 32.0–36.0)
MCV: 91 fL (ref 80.0–100.0)
MPV: 11.3 fL (ref 7.5–12.5)
Platelets: 283 10*3/uL (ref 140–400)
RBC: 4.67 10*6/uL (ref 4.20–5.80)
RDW: 12.9 % (ref 11.0–15.0)
WBC: 5.9 10*3/uL (ref 3.8–10.8)

## 2023-06-16 LAB — COMPREHENSIVE METABOLIC PANEL WITH GFR
AG Ratio: 2.1 (calc) (ref 1.0–2.5)
ALT: 14 U/L (ref 9–46)
AST: 15 U/L (ref 10–35)
Albumin: 4.5 g/dL (ref 3.6–5.1)
Alkaline phosphatase (APISO): 62 U/L (ref 35–144)
BUN: 15 mg/dL (ref 7–25)
CO2: 29 mmol/L (ref 20–32)
Calcium: 9.6 mg/dL (ref 8.6–10.3)
Chloride: 102 mmol/L (ref 98–110)
Creat: 1.16 mg/dL (ref 0.70–1.30)
Globulin: 2.1 g/dL (ref 1.9–3.7)
Glucose, Bld: 93 mg/dL (ref 65–99)
Potassium: 4.6 mmol/L (ref 3.5–5.3)
Sodium: 139 mmol/L (ref 135–146)
Total Bilirubin: 0.7 mg/dL (ref 0.2–1.2)
Total Protein: 6.6 g/dL (ref 6.1–8.1)
eGFR: 75 mL/min/{1.73_m2} (ref >=60)

## 2023-06-16 LAB — PSA: PSA: 2.13 ng/mL (ref <=4.00)

## 2023-06-16 LAB — TSH: TSH: 2.07 m[IU]/L (ref 0.40–4.50)

## 2023-06-16 LAB — LIPID PANEL
Cholesterol: 152 mg/dL (ref <200)
HDL: 41 mg/dL (ref >=40)
LDL Cholesterol (Calc): 90 mg/dL
Non-HDL Cholesterol (Calc): 111 mg/dL (ref <130)
Total CHOL/HDL Ratio: 3.7 (calc) (ref <5.0)
Triglycerides: 110 mg/dL (ref <150)

## 2023-06-16 NOTE — Patient Instructions (Signed)
 Health Maintenance, Male  Adopting a healthy lifestyle and getting preventive care are important in promoting health and wellness. Ask your health care provider about:  The right schedule for you to have regular tests and exams.  Things you can do on your own to prevent diseases and keep yourself healthy.  What should I know about diet, weight, and exercise?  Eat a healthy diet    Eat a diet that includes plenty of vegetables, fruits, low-fat dairy products, and lean protein.  Do not eat a lot of foods that are high in solid fats, added sugars, or sodium.  Maintain a healthy weight  Body mass index (BMI) is a measurement that can be used to identify possible weight problems. It estimates body fat based on height and weight. Your health care provider can help determine your BMI and help you achieve or maintain a healthy weight.  Get regular exercise  Get regular exercise. This is one of the most important things you can do for your health. Most adults should:  Exercise for at least 150 minutes each week. The exercise should increase your heart rate and make you sweat (moderate-intensity exercise).  Do strengthening exercises at least twice a week. This is in addition to the moderate-intensity exercise.  Spend less time sitting. Even light physical activity can be beneficial.  Watch cholesterol and blood lipids  Have your blood tested for lipids and cholesterol at 55 years of age, then have this test every 5 years.  You may need to have your cholesterol levels checked more often if:  Your lipid or cholesterol levels are high.  You are older than 55 years of age.  You are at high risk for heart disease.  What should I know about cancer screening?  Many types of cancers can be detected early and may often be prevented. Depending on your health history and family history, you may need to have cancer screening at various ages. This may include screening for:  Colorectal cancer.  Prostate cancer.  Skin cancer.  Lung  cancer.  What should I know about heart disease, diabetes, and high blood pressure?  Blood pressure and heart disease  High blood pressure causes heart disease and increases the risk of stroke. This is more likely to develop in people who have high blood pressure readings or are overweight.  Talk with your health care provider about your target blood pressure readings.  Have your blood pressure checked:  Every 3-5 years if you are 9-95 years of age.  Every year if you are 85 years old or older.  If you are between the ages of 29 and 29 and are a current or former smoker, ask your health care provider if you should have a one-time screening for abdominal aortic aneurysm (AAA).  Diabetes  Have regular diabetes screenings. This checks your fasting blood sugar level. Have the screening done:  Once every three years after age 23 if you are at a normal weight and have a low risk for diabetes.  More often and at a younger age if you are overweight or have a high risk for diabetes.  What should I know about preventing infection?  Hepatitis B  If you have a higher risk for hepatitis B, you should be screened for this virus. Talk with your health care provider to find out if you are at risk for hepatitis B infection.  Hepatitis C  Blood testing is recommended for:  Everyone born from [birth year] through 1967.  Anyone  with known risk factors for hepatitis C.  Sexually transmitted infections (STIs)  You should be screened each year for STIs, including gonorrhea and chlamydia, if:  You are sexually active and are younger than 55 years of age.  You are older than 55 years of age and your health care provider tells you that you are at risk for this type of infection.  Your sexual activity has changed since you were last screened, and you are at increased risk for chlamydia or gonorrhea. Ask your health care provider if you are at risk.  Ask your health care provider about whether you are at high risk for HIV. Your health care provider  may recommend a prescription medicine to help prevent HIV infection. If you choose to take medicine to prevent HIV, you should first get tested for HIV. You should then be tested every 3 months for as long as you are taking the medicine.  Follow these instructions at home:  Alcohol use  Do not drink alcohol if your health care provider tells you not to drink.  If you drink alcohol:  Limit how much you have to 0-2 drinks a day.  Know how much alcohol is in your drink. In the [place] one drink equals one 12 oz bottle of beer (355 mL), one 5 oz glass of wine (148 mL), or one 1 oz glass of hard liquor (44 mL).  Lifestyle  Do not use any products that contain nicotine or tobacco. These products include cigarettes, chewing tobacco, and vaping devices, such as e-cigarettes. If you need help quitting, ask your health care provider.  Do not use street drugs.  Do not share needles.  Ask your health care provider for help if you need support or information about quitting drugs.  General instructions  Schedule regular health, dental, and eye exams.  Stay current with your vaccines.  Tell your health care provider if:  You often feel depressed.  You have ever been abused or do not feel safe at home.  Summary  Adopting a healthy lifestyle and getting preventive care are important in promoting health and wellness.  Follow your health care provider's instructions about healthy diet, exercising, and getting tested or screened for diseases.  Follow your health care provider's instructions on monitoring your cholesterol and blood pressure.  This information is not intended to replace advice given to you by your health care provider. Make sure you discuss any questions you have with your health care provider.  Document Revised: 06/25/2020 Document Reviewed: 06/25/2020  Elsevier Patient Education  2024 [employer].

## 2023-06-16 NOTE — Progress Notes (Signed)
 Office Note 06/16/2023  CC:  Chief Complaint  Patient presents with   Annual Exam    Pt is fasting   HPI:  Patient is a 53 y.o. male who is here for annual health maintenance exam. He feels very well. He ran a 10 mile race with his daughter last weekend. No acute concerns.  Past Medical History:  Diagnosis Date   Asthmatic bronchitis 2004   Lone episode--?mold exposure when working around a flooded house   Chest wall pain    Colon cancer screening    cologuard neg 10/2022   Congenital malrotation of intestine    incidentally noted on 03/2018.  APPENDIX IN LLQ   Family history of early CAD    F. sudden death age 26.  Boyce Byes d of MI in his 45s.  ETT NORMAL 10/16/15 and 6/3/0/22, coronary calcium score of ZERO 2017 and 2022.   Gross hematuria 03/2018   -->Urol-->9 x 5 mm R upper uretal stone (MET trial)   History of kidney stones 04/20/2018   ESWL for R prox uret cal   Mild hypercholesterolemia    Pulmonary nodule 08/16/2020   stable 5mm on CT coronary ca imaging    Past Surgical History:  Procedure Laterality Date   CARDIOVASCULAR STRESS TEST  10/16/2015   NORMAL   CARDIOVASCULAR STRESS TEST  08/16/2020   ETT normal.   Coronary calcium score  2017   ZERO   CT ABDOMEN PELVIS WO ([facility] HX)  03/31/2018   1)Moderate R hydronephrosis due to 5mm proximal right ureteral calculus. 2)Congenital malrotation of bowel incidentally noted. 3)Mildly enlarged prostate.   CT cardiac scoring  08/16/2020   cor calc score ZERO in 2017 and 2022   EXTRACORPOREAL SHOCK WAVE LITHOTRIPSY Right 04/15/2018   Procedure: EXTRACORPOREAL SHOCK WAVE LITHOTRIPSY (ESWL);  Surgeon: Florencio Hunting, MD;  Location: [facility];  Service: Urology;  Laterality: Right;   MOLE REMOVAL     A few, benign.  Sees a dermatologist every few years.   WISDOM TOOTH EXTRACTION  age 4 yrs    Family History  Problem Relation Age of Onset   Cancer Mother    Heart disease Father    COPD Maternal Grandfather    Stroke  Paternal Grandmother    Heart disease Paternal Grandfather     Social History   Socioeconomic History   Marital status: Married    Spouse name: Not on file   Number of children: Not on file   Years of education: Not on file   Highest education level: Not on file  Occupational History   Not on file  Tobacco Use   Smoking status: Never   Smokeless tobacco: Never  Vaping Use   Vaping status: Never Used  Substance and Sexual Activity   Alcohol use: Yes    Comment: rarely   Drug use: No   Sexual activity: Not on file  Other Topics Concern   Not on file  Social History Narrative   Married, 2 children (1 boy 1 girl).   Orig from [place] area of [place].   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   No tob, rare alcohol, no drugs.   Exercise 3X/week.   Social Drivers of Corporate investment banker Strain: Not on file  Food Insecurity: Not on file  Transportation Needs: Not on file  Physical Activity: Not on file  Stress: Not on file  Social Connections: Not on file  Intimate Partner Violence:  Not on file    Outpatient Medications Prior to Visit  Medication Sig Dispense Refill   ASPIRIN 81 PO Take by mouth daily.     COENZYME Q10 PO Take by mouth daily.     Multiple Vitamins-Minerals (MULTIVITAMIN ADULTS PO) Take by mouth daily.     Omega-3 Fatty Acids (FISH OIL PO) Take by mouth daily.     VITAMIN D PO Take by mouth daily.     No facility-administered medications prior to visit.    Allergies  Allergen Reactions   Penicillins     REACTION: as child    Review of Systems  Constitutional:  Negative for appetite change, chills, fatigue and fever.  HENT:  Negative for congestion, dental problem, ear pain and sore throat.   Eyes:  Negative for discharge, redness and visual disturbance.  Respiratory:  Negative for cough, chest tightness, shortness of breath and wheezing.   Cardiovascular:  Negative for  chest pain, palpitations and leg swelling.  Gastrointestinal:  Negative for abdominal pain, blood in stool, diarrhea, nausea and vomiting.  Genitourinary:  Negative for difficulty urinating, dysuria, flank pain, frequency, hematuria and urgency.  Musculoskeletal:  Negative for arthralgias, back pain, joint swelling, myalgias and neck stiffness.  Skin:  Negative for pallor and rash.  Neurological:  Negative for dizziness, speech difficulty, weakness and headaches.  Hematological:  Negative for adenopathy. Does not bruise/bleed easily.  Psychiatric/Behavioral:  Negative for confusion and sleep disturbance. The patient is not nervous/anxious.     PE;    06/16/2023    8:03 AM 06/06/2022    3:48 PM 05/28/2021    8:05 AM  Vitals with BMI  Height 6\' 2"  6\' 2"  6' 1.75"  Weight 190 lbs 10 oz 201 lbs 3 oz 200 lbs 13 oz  BMI 24.46 25.82 25.96  Systolic 97 118 107  Diastolic 70 80 68  Pulse 55 60 62    Gen: Alert, well appearing.  Patient is oriented to person, place, time, and situation. AFFECT: pleasant, lucid thought and speech. ENT: Ears: EACs clear, normal epithelium.  TMs with good light reflex and landmarks bilaterally.  Eyes: no injection, icteris, swelling, or exudate.  EOMI, PERRLA. Nose: no drainage or turbinate edema/swelling.  No injection or focal lesion.  Mouth: lips without lesion/swelling.  Oral mucosa pink and moist.  Dentition intact and without obvious caries or gingival swelling.  Oropharynx without erythema, exudate, or swelling.  Neck: supple/nontender.  No LAD, mass, or TM.  Carotid pulses 2+ bilaterally, without bruits. CV: RRR, no m/r/g.   LUNGS: CTA bilat, nonlabored resps, good aeration in all lung fields. ABD: soft, NT, ND, BS normal.  No hepatospenomegaly or mass.  No bruits. EXT: no clubbing, cyanosis, or edema.  Musculoskeletal: no joint swelling, erythema, warmth, or tenderness.  ROM of all joints intact. Skin - no sores or suspicious lesions or rashes or color  changes  Pertinent labs:  Lab Results  Component Value Date   TSH 2.39 06/06/2022   Lab Results  Component Value Date   WBC 6.0 06/06/2022   HGB 15.4 06/06/2022   HCT 44.9 06/06/2022   MCV 87.4 06/06/2022   PLT 324 06/06/2022   Lab Results  Component Value Date   CREATININE 1.13 06/06/2022   BUN 12 06/06/2022   NA 138 06/06/2022   K 3.9 06/06/2022   CL 103 06/06/2022   CO2 25 06/06/2022   Lab Results  Component Value Date   ALT 14 06/06/2022   AST 17 06/06/2022  ALKPHOS 62 05/28/2021   BILITOT 0.7 06/06/2022   Lab Results  Component Value Date   CHOL 167 06/06/2022   Lab Results  Component Value Date   HDL 40 06/06/2022   Lab Results  Component Value Date   LDLCALC 110 (H) 06/06/2022   Lab Results  Component Value Date   TRIG 84 06/06/2022   Lab Results  Component Value Date   CHOLHDL 4.2 06/06/2022   Lab Results  Component Value Date   PSA 2.48 06/06/2022   PSA 1.91 05/28/2021   PSA 1.69 05/24/2020   Lab Results  Component Value Date   HGBA1C 5.4 05/24/2015   ASSESSMENT AND PLAN:   Health maintenance exam: Reviewed age and gender appropriate health maintenance issues (prudent diet, regular exercise, health risks of tobacco and excessive alcohol, use of seatbelts, fire alarms in home, use of sunscreen).  Also reviewed age and gender appropriate health screening as well as vaccine recommendations. Vaccines:  ALL UTD. Labs: fasting HP labs + PSA ordered Prostate ca screening: PSA today. Colon ca screening: Cologuard - 10/2022.  Repeat approximately 10/2025.  An After Visit Summary was printed and given to the patient.  FOLLOW UP:  Return in about 1 year (around 06/15/2024) for annual CPE (fasting).  Signed:  Arletha Lady, MD           06/16/2023

## 2023-06-17 ENCOUNTER — Encounter: Payer: Self-pay | Admitting: Family Medicine

## 2024-06-16 ENCOUNTER — Encounter: Admitting: Family Medicine
